# Patient Record
Sex: Male | Born: 1990 | Race: White | Hispanic: No | Marital: Single | Smoking: Current every day smoker
Health system: Southern US, Community
[De-identification: ages and names within clinical notes are randomized; demographics above are authoritative.]

## PROBLEM LIST (undated history)

## (undated) DIAGNOSIS — F17200 Nicotine dependence, unspecified, uncomplicated: Secondary | ICD-10-CM

## (undated) DIAGNOSIS — Z8619 Personal history of other infectious and parasitic diseases: Secondary | ICD-10-CM

## (undated) DIAGNOSIS — G40909 Epilepsy, unspecified, not intractable, without status epilepticus: Secondary | ICD-10-CM

## (undated) HISTORY — DX: Nicotine dependence, unspecified, uncomplicated: F17.200

## (undated) HISTORY — DX: Personal history of other infectious and parasitic diseases: Z86.19

---
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
## 2012-02-07 ENCOUNTER — Emergency Department (HOSPITAL_COMMUNITY)
Admission: EM | Admit: 2012-02-07 | Discharge: 2012-02-07 | Disposition: A | Discharge status: Home / Self Care | Payer: BC Managed Care – PPO | Source: Home / Self Care | Attending: Emergency Medicine | Admitting: Emergency Medicine

## 2012-02-07 ENCOUNTER — Encounter (HOSPITAL_COMMUNITY): Payer: Self-pay | Admitting: *Deleted

## 2012-02-07 DIAGNOSIS — R1013 Epigastric pain: Secondary | ICD-10-CM

## 2012-02-07 DIAGNOSIS — K3189 Other diseases of stomach and duodenum: Secondary | ICD-10-CM

## 2012-02-07 HISTORY — DX: Epilepsy, unspecified, not intractable, without status epilepticus: G40.909

## 2012-02-07 LAB — CBC WITH DIFFERENTIAL/PLATELET
Basophils Absolute: 0.1 10*3/uL (ref 0.0–0.1)
Basophils Relative: 1 % (ref 0–1)
Eosinophils Absolute: 0.3 10*3/uL (ref 0.0–0.7)
Eosinophils Relative: 2 % (ref 0–5)
HCT: 44.4 % (ref 39.0–52.0)
Hemoglobin: 15.8 g/dL (ref 13.0–17.0)
Lymphocytes Relative: 19 % (ref 12–46)
Lymphs Abs: 2 10*3/uL (ref 0.7–4.0)
MCH: 31 pg (ref 26.0–34.0)
MCHC: 35.6 g/dL (ref 30.0–36.0)
MCV: 87.2 fL (ref 78.0–100.0)
Monocytes Absolute: 1.2 10*3/uL — ABNORMAL HIGH (ref 0.1–1.0)
Monocytes Relative: 12 % (ref 3–12)
Neutro Abs: 7 10*3/uL (ref 1.7–7.7)
Neutrophils Relative %: 67 % (ref 43–77)
Platelets: 258 10*3/uL (ref 150–400)
RBC: 5.09 MIL/uL (ref 4.22–5.81)
RDW: 12.5 % (ref 11.5–15.5)
WBC: 10.4 10*3/uL (ref 4.0–10.5)

## 2012-02-07 LAB — POCT H PYLORI SCREEN: H. PYLORI SCREEN, POC: NEGATIVE

## 2012-02-07 MED ORDER — OMEPRAZOLE 20 MG PO CPDR: 20.0000 mg | DELAYED_RELEASE_CAPSULE | Freq: Two times a day (BID) | ORAL | Status: DC

## 2012-02-07 MED ORDER — SUCRALFATE 1 GM/10ML PO SUSP: 1.0000 g | Freq: Four times a day (QID) | ORAL | Status: DC

## 2012-02-07 NOTE — ED Provider Notes (Signed)
Chief Complaint  Patient presents with  . Abdominal Pain    History of Present Illness:    The patient is a 21 year old male with a one-week history of bilateral mid and periumbilical abdominal pain without radiation. This sometimes feels like a cramping and sometimes like a sharp pain. The pain waxes and wanes and sometimes gets up to 6-7/10 in intensity. It's worse after eating food, especially fried foods, spicy foods, or first thing in the morning. It's better after a bowel movement and he tried TUMS which did seem to help for about 30 minutes. He's had some sweats but no fever or chills. He's felt occasionally nauseated and vomited twice. No hematemesis, coffee-ground emesis, or bilious emesis. He feels tired, run down, and drained. He doesn't have much of an appetite. His stools have been a little bit loose but he denies any hematochezia or melena. He smokes a half pack of cigarettes per day and is a social drinker. He drinks sodas and tea. He has a history of seizures and is on seizure medication. He denies any history of ulcer disease.  Review of Systems:  Other than noted above, the patient denies any of the following symptoms: Constitutional:  No fever, chills, fatigue, weight loss or anorexia. Lungs:  No cough or shortness of breath. Heart:  No chest pain, palpitations, syncope or edema.  No cardiac history. Abdomen:  No nausea, vomiting, hematememesis, melena, diarrhea, or hematochezia. GU:  No dysuria, frequency, urgency, or hematuria.  No testicular pain or swelling. Skin:  No rash or itching.  PMFSH:  Past medical history, family history, social history, meds, and allergies were reviewed along with nurse's notes.  No prior abdominal surgeries or history of GI problems.  No use of NSAIDs or aspirin.  No excessive  alcohol intake.  Physical Exam:   Vital signs:  BP 148/74  Pulse 50  Temp 98.1 F (36.7 C) (Oral)  Resp 16  SpO2 100% Gen:  Alert, oriented, in no distress. Lungs:   Breath sounds clear and equal bilaterally.  No wheezes, rales or rhonchi. Heart:  Regular rhythm.  No gallops or murmers.   Abdomen:  Abdomen was soft, flat, nondistended. He has slight epigastric tenderness to palpation without guarding or rebound. Murphy sign and Murphy's punch were negative. No organomegaly or mass. Bowel sounds were normally active. Rectal exam:  Digital rectal exam reveals no masses, normal prostate, no tenderness, and heme-negative stool. Skin:  Clear, warm and dry.  No rash.  Labs:   Results for orders placed during the hospital encounter of 02/07/12  CBC WITH DIFFERENTIAL      Component Value Range   WBC 10.4  4.0 - 10.5 K/uL   RBC 5.09  4.22 - 5.81 MIL/uL   Hemoglobin 15.8  13.0 - 17.0 g/dL   HCT 69.6  29.5 - 28.4 %   MCV 87.2  78.0 - 100.0 fL   MCH 31.0  26.0 - 34.0 pg   MCHC 35.6  30.0 - 36.0 g/dL   RDW 13.2  44.0 - 10.2 %   Platelets 258  150 - 400 K/uL   Neutrophils Relative 67  43 - 77 %   Neutro Abs 7.0  1.7 - 7.7 K/uL   Lymphocytes Relative 19  12 - 46 %   Lymphs Abs 2.0  0.7 - 4.0 K/uL   Monocytes Relative 12  3 - 12 %   Monocytes Absolute 1.2 (*) 0.1 - 1.0 K/uL   Eosinophils Relative 2  0 -  5 %   Eosinophils Absolute 0.3  0.0 - 0.7 K/uL   Basophils Relative 1  0 - 1 %   Basophils Absolute 0.1  0.0 - 0.1 K/uL  POCT H PYLORI SCREEN      Component Value Range   H. PYLORI SCREEN, POC NEGATIVE  NEGATIVE  OCCULT BLOOD, POC DEVICE      Component Value Range   Fecal Occult Bld NEGATIVE       Assessment:  The encounter diagnosis was Dyspepsia. With gastritis, ulcer disease, reflux esophagitis being possibilities. Also irritable bowel syndrome is a possibility as well.  Plan:   1.  The following meds were prescribed:   New Prescriptions   OMEPRAZOLE (PRILOSEC) 20 MG CAPSULE    Take 1 capsule (20 mg total) by mouth 2 (two) times daily before a meal.   SUCRALFATE (CARAFATE) 1 GM/10ML SUSPENSION    Take 10 mLs (1 g total) by mouth 4 (four) times  daily.   2.  The patient was instructed in symptomatic care and handouts were given. 3.  The patient was told to return if becoming worse in any way, if no better in 3 or 4 days, and given some red flag symptoms that would indicate earlier return. The patient was told to avoid spicy food, greasy food, acid foods, caffeine, and nonsteroidal anti-inflammatory drugs.  Follow up:  The patient was told to follow up with Dr. Vilinda Boehringer if no better in 2 weeks.     Reuben Likes, MD 02/07/12 [phone]

## 2012-02-07 NOTE — ED Notes (Signed)
Pt with c/o intermittent abdominal pain x 6 days  - low abdominal pain describes pain as the type pain have prior to bowel movement - loose bm's since Friday

## 2013-02-15 ENCOUNTER — Ambulatory Visit (INDEPENDENT_AMBULATORY_CARE_PROVIDER_SITE_OTHER): Payer: BC Managed Care – PPO | Admitting: Family Medicine

## 2013-02-15 ENCOUNTER — Encounter: Payer: Self-pay | Admitting: Family Medicine

## 2013-02-15 VITALS — BP 118/78 | HR 64 | Temp 97.9°F | Ht 73.5 in | Wt 252.8 lb

## 2013-02-15 DIAGNOSIS — G40909 Epilepsy, unspecified, not intractable, without status epilepticus: Secondary | ICD-10-CM | POA: Insufficient documentation

## 2013-02-15 DIAGNOSIS — F17200 Nicotine dependence, unspecified, uncomplicated: Secondary | ICD-10-CM | POA: Insufficient documentation

## 2013-02-15 DIAGNOSIS — L989 Disorder of the skin and subcutaneous tissue, unspecified: Secondary | ICD-10-CM

## 2013-02-15 NOTE — Addendum Note (Signed)
Addended by: Josph Macho A on: 02/15/2013 11:31 AM   Modules accepted: Orders

## 2013-02-15 NOTE — Assessment & Plan Note (Signed)
Slowly cutting back on his own.   Encouraged to continue this. Has e cig at home.

## 2013-02-15 NOTE — Patient Instructions (Addendum)
Good to see you today. I have requested records from Dr. Marjory Lies of latest office note and blood work. Return as needed or in 1-2 years for physical. Keep bandaid on for 24 hours then may remove and wash with soapy water, and pat dry.  Let us know if questions.

## 2013-02-15 NOTE — Assessment & Plan Note (Signed)
Stable on current dose keppra. I have requested latest office note from Dr. Marjory Lies.

## 2013-02-15 NOTE — Progress Notes (Signed)
Subjective:    Patient ID: Zachary [name], male    DOB: Jul 07, 1990, 22 y.o.   MRN: [id number]  HPI CC: new pt to establish  Would like skin tag on leg removed.  Present for years.  Not changing, but may have enlagted over years.  No bleeding.  Stays itchy and irritated because catches on clothes and thighs.  H/o seizure disorder - on keppra 1500mg  daily, seizure free since 10/2010.  06/2012 increased dose of keppra from 1000mg  to 1500mg .  Feels skipping meals increases seizure risk.  Smoking - 3-4 cig/day, backing down.  Has e cig at home.  Preventative: No recent CPE. Tetanus - did receive prior to college (4-5 yrs ago)  Caffeine: 1 cup/day Lives with mother and father, no pets.  1 older brother Occ: installing POS systems, security Edu: college Haematologist) Activity: golfs regularly Diet: good water, fruits/vegetables some  Medications and allergies reviewed and updated in chart.  Past histories reviewed and updated if relevant as below. There are no active problems to display for this patient.  Past Medical History  Diagnosis Date  . Seizure disorder 2011    [facility] at [facility]  . History of chicken pox    Past Surgical History  Procedure Laterality Date  . Appendectomy  2004   History  Substance Use Topics  . Smoking status: Current Every Day Smoker    Types: Cigarettes  . Smokeless tobacco: Never Used     Comment: 3-4 cigarettes daily  . Alcohol Use: Yes     Comment: Occasional (weekends) 10 per sitting   Family History  Problem Relation Age of Onset  . Cancer Maternal Aunt     breast  . Cancer Maternal Grandmother     breast  . Cancer Paternal Grandmother     breast  . Cancer Maternal Aunt 55    colon  . CAD Neg Hx   . Stroke Neg Hx   . Diabetes Neg Hx   . Hypertension Neg Hx    No Known Allergies Current Outpatient Prescriptions on File Prior to Visit  Medication Sig Dispense Refill  . levETIRAcetam (KEPPRA) 500 MG tablet Take 1,500 mg by mouth once.         No current facility-administered medications on file prior to visit.    Review of Systems  Constitutional: Negative for fever, chills, activity change, appetite change, fatigue and unexpected weight change.  HENT: Negative for hearing loss and neck pain.   Eyes: Negative for visual disturbance.  Respiratory: Negative for cough, chest tightness, shortness of breath and wheezing.   Cardiovascular: Negative for chest pain, palpitations and leg swelling.  Gastrointestinal: Positive for abdominal pain (occaisonal). Negative for nausea, vomiting, diarrhea, constipation, blood in stool and abdominal distention.  Genitourinary: Negative for hematuria and difficulty urinating.  Musculoskeletal: Negative for myalgias and arthralgias.  Skin: Negative for rash.  Neurological: Positive for seizures (none recently, controlled on Keppra). Negative for dizziness, syncope and headaches.  Hematological: Negative for adenopathy. Does not bruise/bleed easily.  Psychiatric/Behavioral: Negative for dysphoric mood. The patient is not nervous/anxious.        Objective:   Physical Exam  Nursing note and vitals reviewed. Constitutional: He is oriented to person, place, and time. He appears well-developed and well-nourished. No distress.  HENT:  Head: Normocephalic and atraumatic.  Right Ear: Hearing, tympanic membrane, external ear and ear canal normal.  Left Ear: Hearing, tympanic membrane, external ear and ear canal normal.  Nose: Nose normal.  Mouth/Throat: Oropharynx is clear  and moist. No oropharyngeal exudate.  Eyes: Conjunctivae and EOM are normal. Pupils are equal, round, and reactive to light. No scleral icterus.  Neck: Normal range of motion. Neck supple. No thyromegaly present.  Cardiovascular: Normal rate, regular rhythm, normal heart sounds and intact distal pulses.   No murmur heard. Pulses:      Radial pulses are 2+ on the right side, and 2+ on the left side.  Pulmonary/Chest: Effort  normal and breath sounds normal. No respiratory distress. He has no wheezes. He has no rales.  Abdominal: Soft. Bowel sounds are normal. He exhibits no distension and no mass. There is no tenderness. There is no rebound and no guarding.  Musculoskeletal: Normal range of motion. He exhibits no edema.  Lymphadenopathy:    He has no cervical adenopathy.  Neurological: He is alert and oriented to person, place, and time.  CN grossly intact, station and gait intact  Skin: Skin is warm and dry. No rash noted.     Large pedunculated skin growth on left upper inner thigh  Psychiatric: He has a normal mood and affect. His behavior is normal. Judgment and thought content normal.       Assessment & Plan:

## 2013-02-15 NOTE — Assessment & Plan Note (Addendum)
IC obtained and in chart.  Area cleaned with alcohol pad and then anesthetized with 1cc lidocaine with epi.  Base snipped with sterile scissors and cauterized with silver nitrate.  Dressed with abx ointment and bandaid.  Aftercare instructions provided. Somewhat large skin tag- will send to pathology to eval for NF.

## 2013-02-19 ENCOUNTER — Encounter: Payer: Self-pay | Admitting: *Deleted

## 2013-02-21 ENCOUNTER — Encounter: Payer: Self-pay | Admitting: Family Medicine

## 2013-05-21 ENCOUNTER — Telehealth: Payer: Self-pay | Admitting: Nurse Practitioner

## 2013-05-22 MED ORDER — LEVETIRACETAM ER 500 MG PO TB24: 1500.0000 mg | ORAL_TABLET | Freq: Every day | ORAL | Status: DC

## 2013-05-22 NOTE — Telephone Encounter (Signed)
I have not had access to Epic for over 2 weeks.  The Rx has now been sent.

## 2013-08-07 ENCOUNTER — Encounter: Payer: Self-pay | Admitting: Nurse Practitioner

## 2013-08-07 ENCOUNTER — Ambulatory Visit (INDEPENDENT_AMBULATORY_CARE_PROVIDER_SITE_OTHER): Payer: BC Managed Care – PPO | Admitting: Nurse Practitioner

## 2013-08-07 ENCOUNTER — Encounter (INDEPENDENT_AMBULATORY_CARE_PROVIDER_SITE_OTHER): Payer: Self-pay

## 2013-08-07 VITALS — BP 152/75 | HR 63 | Ht 73.0 in | Wt 257.0 lb

## 2013-08-07 DIAGNOSIS — G40209 Localization-related (focal) (partial) symptomatic epilepsy and epileptic syndromes with complex partial seizures, not intractable, without status epilepticus: Secondary | ICD-10-CM

## 2013-08-07 MED ORDER — LEVETIRACETAM ER 500 MG PO TB24: 1500.0000 mg | ORAL_TABLET | Freq: Every day | ORAL | Status: DC

## 2013-08-07 NOTE — Patient Instructions (Signed)
Continue Keppra at current dose will refill Call for any seizure activity Follow-up yearly and when necessary 

## 2013-08-07 NOTE — Progress Notes (Signed)
[facility]  PATIENT: Zachary [name] DOB: 07/26/1990   REASON FOR VISIT: followup for seizure disorder   HISTORY OF PRESENT ILLNESS:[name], 23 year old male returns for followup. He has a history of seizure disorder with last seizure occurring 10/26/2010. He is currently on Keppra extended release 500 mg 3 daily without side effects. MRI of the brain and EEG were both normal. He returns for reevaluation and refills  PRIOR HPI: right-handed male with no past medical history here for evaluation of seizure disorder.  Patient had 3 seizures (Mar 2011, Mar 2012, Apr 2012).  Symptoms start with right sided hearing loss, ringing in the ear, head turning to the right, then loss of consciousness.  Some convulsions, tongue biting, but no incontinence.  Patient has been in college at [employer], but now has moved back home. No history of head trauma or CNS infx.  Has maternal cousin with childhood sz, but she outgrew them.      REVIEW OF SYSTEMS: Full 14 system review of systems performed and notable only for those listed, all others are neg:  Constitutional: N/A  Cardiovascular: N/A  Ear/Nose/Throat: N/A  Skin: N/A  Eyes: N/A  Respiratory: N/A  Gastroitestinal: N/A  Hematology/Lymphatic: N/A  Endocrine: N/A Musculoskeletal:N/A  Allergy/Immunology: N/A  Neurological: N/A Psychiatric: N/A   ALLERGIES: No Known Allergies  HOME MEDICATIONS: Outpatient Prescriptions Prior to Visit  Medication Sig Dispense Refill  . levETIRAcetam (KEPPRA XR) 500 MG 24 hr tablet Take 3 tablets (1,500 mg total) by mouth daily.  270 tablet  0   No facility-administered medications prior to visit.    PAST MEDICAL HISTORY: Past Medical History  Diagnosis Date  . Seizure disorder 2011    [facility], MRI and EEG WNL  . History of chicken pox   . Smoker     PAST SURGICAL HISTORY: Past Surgical History  Procedure Laterality Date  . Appendectomy  2004     FAMILY HISTORY: Family History  Problem Relation Age of Onset  . Cancer Maternal Aunt     breast  . Cancer Maternal Grandmother     breast  . Cancer Paternal Grandmother     breast  . Cancer Maternal Aunt 55    colon  . CAD Neg Hx   . Stroke Neg Hx   . Diabetes Neg Hx   . Hypertension Neg Hx     SOCIAL HISTORY: History   Social History  . Marital Status: Single    Spouse Name: N/A    Number of Children: N/A  . Years of Education: N/A   Occupational History  . Not on file.   Social History Main Topics  . Smoking status: Current Every Day Smoker    Types: Cigarettes  . Smokeless tobacco: Never Used     Comment: 3-4 cigarettes daily  . Alcohol Use: Yes     Comment: Occasional (weekends) 10 per sitting  . Drug Use: No  . Sexual Activity: Not on file   Other Topics Concern  . Not on file   Social History Narrative   Caffeine: 1 cup/day   Lives with mother and father, has 1 dog.  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           PHYSICAL EXAM  Filed Vitals:   08/07/13 1430  BP: 152/75  Pulse: 63  Height: 6\' 1"  (1.854 m)  Weight: 257 lb (116.574 kg)   Body mass index is 33.91 kg/(m^2).  Generalized: Well developed, in no acute distress    Neurological examination   Mentation: Alert oriented to time, place, history taking. Follows all commands speech and language fluent  Cranial nerve II-XII: Fundoscopic exam reveals sharp disc margins.Pupils were equal round reactive to light extraocular movements were full, visual field were full on confrontational test. Facial sensation and strength were normal. hearing was intact to finger rubbing bilaterally. Uvula tongue midline. head turning and shoulder shrug were normal and symmetric.Tongue protrusion into cheek strength was normal. Motor: normal bulk  and tone, full strength in the BUE, BLE, fine finger movements normal, no pronator drift. No focal weakness Coordination: finger-nose-finger, heel-to-shin bilaterally, no dysmetria Reflexes: Brachioradialis 2/2, biceps 2/2, triceps 2/2, patellar 2/2, Achilles 2/2, plantar responses were flexor bilaterally. Gait and Station: Rising up from seated position without assistance, normal stance,  moderate stride, good arm swing, smooth turning, able to perform tiptoe, and heel walking without difficulty. Tandem gait is steady  DIAGNOSTIC DATA (LABS, IMAGING, TESTING)   ASSESSMENT AND PLAN  23 y.o. year old male  has a past medical history of Seizure disorder (2011); currently stable on Keppra with last seizure occurring April 2012.   Continue Keppra at current dose will refill Call for any seizure activity Followup yearly and when necessary Nilda Riggs, [facility], [facility], APRN  [facility] [address], [address] [place], [place] [address] [phone]

## 2013-08-20 NOTE — Progress Notes (Signed)
I reviewed note and agree with plan.   Suanne MarkerVIKRAM R. PENUMALLI, MD 08/20/2013, 8:53 AM Certified in Neurology, Neurophysiology and Neuroimaging  [facility] [address], [address] [place], [place] [address] [phone]

## 2014-07-24 ENCOUNTER — Encounter (HOSPITAL_COMMUNITY): Payer: Self-pay | Admitting: *Deleted

## 2014-07-24 ENCOUNTER — Emergency Department (HOSPITAL_COMMUNITY): Payer: BLUE CROSS/BLUE SHIELD

## 2014-07-24 ENCOUNTER — Emergency Department (HOSPITAL_COMMUNITY)
Admission: EM | Admit: 2014-07-24 | Discharge: 2014-07-24 | Disposition: A | Discharge status: Home / Self Care | Payer: BLUE CROSS/BLUE SHIELD | Attending: Emergency Medicine | Admitting: Emergency Medicine

## 2014-07-24 DIAGNOSIS — Z23 Encounter for immunization: Secondary | ICD-10-CM | POA: Diagnosis not present

## 2014-07-24 DIAGNOSIS — G40909 Epilepsy, unspecified, not intractable, without status epilepticus: Secondary | ICD-10-CM | POA: Insufficient documentation

## 2014-07-24 DIAGNOSIS — Y9289 Other specified places as the place of occurrence of the external cause: Secondary | ICD-10-CM | POA: Insufficient documentation

## 2014-07-24 DIAGNOSIS — Y9389 Activity, other specified: Secondary | ICD-10-CM | POA: Diagnosis not present

## 2014-07-24 DIAGNOSIS — Z79899 Other long term (current) drug therapy: Secondary | ICD-10-CM | POA: Diagnosis not present

## 2014-07-24 DIAGNOSIS — S79922A Unspecified injury of left thigh, initial encounter: Secondary | ICD-10-CM | POA: Diagnosis not present

## 2014-07-24 DIAGNOSIS — W06XXXA Fall from bed, initial encounter: Secondary | ICD-10-CM | POA: Diagnosis not present

## 2014-07-24 DIAGNOSIS — S79921A Unspecified injury of right thigh, initial encounter: Secondary | ICD-10-CM | POA: Diagnosis not present

## 2014-07-24 DIAGNOSIS — S0990XA Unspecified injury of head, initial encounter: Secondary | ICD-10-CM | POA: Diagnosis present

## 2014-07-24 DIAGNOSIS — Z87898 Personal history of other specified conditions: Secondary | ICD-10-CM

## 2014-07-24 DIAGNOSIS — S0101XA Laceration without foreign body of scalp, initial encounter: Secondary | ICD-10-CM

## 2014-07-24 DIAGNOSIS — W19XXXA Unspecified fall, initial encounter: Secondary | ICD-10-CM

## 2014-07-24 DIAGNOSIS — Z8619 Personal history of other infectious and parasitic diseases: Secondary | ICD-10-CM | POA: Diagnosis not present

## 2014-07-24 DIAGNOSIS — M79605 Pain in left leg: Secondary | ICD-10-CM

## 2014-07-24 DIAGNOSIS — Y998 Other external cause status: Secondary | ICD-10-CM | POA: Diagnosis not present

## 2014-07-24 DIAGNOSIS — M79604 Pain in right leg: Secondary | ICD-10-CM

## 2014-07-24 DIAGNOSIS — Z720 Tobacco use: Secondary | ICD-10-CM | POA: Diagnosis not present

## 2014-07-24 LAB — BASIC METABOLIC PANEL
ANION GAP: 13 (ref 5–15)
BUN: 11 mg/dL (ref 6–23)
CALCIUM: 9.1 mg/dL (ref 8.4–10.5)
CO2: 19 mmol/L (ref 19–32)
Chloride: 105 mEq/L (ref 96–112)
Creatinine, Ser: 1.1 mg/dL (ref 0.50–1.35)
Glucose, Bld: 140 mg/dL — ABNORMAL HIGH (ref 70–99)
POTASSIUM: 5.7 mmol/L — AB (ref 3.5–5.1)
SODIUM: 137 mmol/L (ref 135–145)

## 2014-07-24 LAB — CBC
HCT: 45.1 % (ref 39.0–52.0)
Hemoglobin: 16 g/dL (ref 13.0–17.0)
MCH: 30.8 pg (ref 26.0–34.0)
MCHC: 35.5 g/dL (ref 30.0–36.0)
MCV: 86.7 fL (ref 78.0–100.0)
PLATELETS: 316 10*3/uL (ref 150–400)
RBC: 5.2 MIL/uL (ref 4.22–5.81)
RDW: 12.4 % (ref 11.5–15.5)
WBC: 18.2 10*3/uL — AB (ref 4.0–10.5)

## 2014-07-24 LAB — CBG MONITORING, ED: GLUCOSE-CAPILLARY: 159 mg/dL — AB (ref 70–99)

## 2014-07-24 MED ORDER — HYDROCODONE-ACETAMINOPHEN 5-325 MG PO TABS
1.0000 | ORAL_TABLET | Freq: Once | ORAL | Status: AC
  Administered 2014-07-24: 1 via ORAL
  Filled 2014-07-24: qty 1

## 2014-07-24 MED ORDER — ONDANSETRON HCL 4 MG/2ML IJ SOLN
4.0000 mg | Freq: Once | INTRAMUSCULAR | Status: AC
  Administered 2014-07-24: 4 mg via INTRAVENOUS
  Filled 2014-07-24: qty 2

## 2014-07-24 MED ORDER — LIDOCAINE HCL (PF) 1 % IJ SOLN
5.0000 mL | Freq: Once | INTRAMUSCULAR | Status: AC
  Administered 2014-07-24: 5 mL
  Filled 2014-07-24: qty 5

## 2014-07-24 MED ORDER — LEVETIRACETAM 750 MG PO TABS
1500.0000 mg | ORAL_TABLET | Freq: Once | ORAL | Status: AC
  Administered 2014-07-24: 1500 mg via ORAL
  Filled 2014-07-24: qty 2

## 2014-07-24 MED ORDER — TETANUS-DIPHTH-ACELL PERTUSSIS 5-2.5-18.5 LF-MCG/0.5 IM SUSP
0.5000 mL | Freq: Once | INTRAMUSCULAR | Status: AC
  Administered 2014-07-24: 0.5 mL via INTRAMUSCULAR
  Filled 2014-07-24: qty 0.5

## 2014-07-24 NOTE — ED Notes (Signed)
Patient father states he heard a loud noise and family found patient on the floor in his room.  Patient states he could not sleep well last night.  Patient does not recall what happened.  Patient is alert.  Patient has hx of seizures.  He takes his meds as directed,  Patient is on keppra for seizures.  Patient states his legs are sore bil lower legs.  Patient has a large laceration to his head.  Father states he has had dry heaves x 2-3 times since the fall.

## 2014-07-24 NOTE — ED Provider Notes (Signed)
CSN: [id number]     Arrival date & time 07/24/14  [id number] History   First MD Initiated Contact with Patient 07/24/14 0809     Chief Complaint  Patient presents with  . Fall  . Head Injury     (Consider location/radiation/quality/duration/timing/severity/associated sxs/prior Treatment) HPI  Zachary [name] is a 24 y.o. male with PMH of seizures on Keppra presenting with fall from his bed with right scalp laceration. Patient's father heard a loudhe came into the room. The patient does not remember the incident. The father states patient was confused about what it happened but was oriented to person time and place. Family states he is currently mentating at baseline. Family states patient has had a total of 3 seizures in his lifetime. He has appointment with his neurologist, [facility] next month. Family states he has not had a seizure 8 years. Patient reports compliance with his medication with his last dose yesterday morning. He takes medication daily. Patient denies headache, neck pain, slurred speech, weakness. Patient with complaints of bilateral calf soreness. He states it feels like he has had cramps. Father also states he has had dry heaves 3 since the fall. A shunt was in bed about 1-2 feet above the floor.   Past Medical History  Diagnosis Date  . Seizure disorder 2011    [facility]  . History of chicken pox   . Smoker    Past Surgical History  Procedure Laterality Date  . Appendectomy  2004   Family History  Problem Relation Age of Onset  . Cancer Maternal Aunt     breast  . Cancer Maternal Grandmother     breast  . Cancer Paternal Grandmother     breast  . Cancer Maternal Aunt 55    colon  . CAD Neg Hx   . Stroke Neg Hx   . Diabetes Neg Hx   . Hypertension Neg Hx    History  Substance Use Topics  . Smoking status: Current Every Day Smoker    Types: Cigarettes  . Smokeless tobacco: Never Used     Comment: 3-4 cigarettes daily  . Alcohol Use: Yes      Comment: Occasional (weekends) 10 per sitting    Review of Systems 10 Systems reviewed and are negative for acute change except as noted in the HPI.    Allergies  Review of patient's allergies indicates no known allergies.  Home Medications   Prior to Admission medications   Medication Sig Start Date End Date Taking? Authorizing Provider  acetaminophen (TYLENOL) 500 MG tablet Take 1,000 mg by mouth every 6 (six) hours as needed for mild pain.   Yes Historical Provider, MD  levETIRAcetam (KEPPRA XR) 500 MG 24 hr tablet Take 3 tablets (1,500 mg total) by mouth daily. 08/07/13  Yes Nilda RiggsNancy Carolyn Martin, NP   BP 141/65 mmHg  Pulse 78  Temp(Src) 98 F (36.7 C) (Oral)  Resp 20  Ht 6\' 1"  (1.854 m)  Wt 255 lb (115.667 kg)  BMI 33.65 kg/m2  SpO2 99% Physical Exam  Constitutional: He appears well-developed and well-nourished. No distress.  HENT:  Head: Normocephalic and atraumatic.  Mouth/Throat: Oropharynx is clear and moist.  6cm laceration to right scalp to right forehead.  Eyes: Conjunctivae and EOM are normal. Pupils are equal, round, and reactive to light. Right eye exhibits no discharge. Left eye exhibits no discharge.  Neck: Normal range of motion. Neck supple.  No nuchal rigidity  Cardiovascular: Normal  rate, regular rhythm and normal heart sounds.   Pulmonary/Chest: Effort normal and breath sounds normal. No respiratory distress. He has no wheezes.  Abdominal: Soft. Bowel sounds are normal. He exhibits no distension. There is no tenderness.  Musculoskeletal:  Bilateral calf tightness with tenderness. No swelling, erythema.   Neurological: He is alert. No cranial nerve deficit. He exhibits normal muscle tone. Coordination normal.  Speech is clear and goal oriented. Peripheral visual fields intact. Strength 5/5 in upper and lower extremities. Sensation intact. Intact rapid alternating movements, finger to nose, and heel to shin. Negative Romberg. No pronator drift. Normal  gait.    Skin: Skin is warm and dry. He is not diaphoretic.  Nursing note and vitals reviewed.   ED Course  Procedures (including critical care time) Labs Review Labs Reviewed  BASIC METABOLIC PANEL - Abnormal; Notable for the following:    Potassium 5.7 (*)    Glucose, Bld 140 (*)    All other components within normal limits  CBC - Abnormal; Notable for the following:    WBC 18.2 (*)    All other components within normal limits  CBG MONITORING, ED - Abnormal; Notable for the following:    Glucose-Capillary 159 (*)    All other components within normal limits  LEVETIRACETAM LEVEL    Imaging Review Ct Head Wo Contrast  07/24/2014   CLINICAL DATA:  History of seizures, right forehead laceration  EXAM: CT HEAD WITHOUT CONTRAST  TECHNIQUE: Contiguous axial images were obtained from the base of the skull through the vertex without intravenous contrast.  COMPARISON:  None.  FINDINGS: No skull fracture is noted. Mild mucosal thickening right ethmoid air cells. The mastoid air cells are unremarkable.  No intracranial hemorrhage, mass effect or midline shift. No acute infarction. No mass lesion is noted on this unenhanced scan. No hydrocephalus. The gray and white-matter differentiation is preserved.  IMPRESSION: No acute intracranial abnormality.   Electronically Signed   By: Natasha Mead M.D.   On: 07/24/2014 09:14     EKG Interpretation   Date/Time:  Wednesday July 24 2014 08:14:26 EST Ventricular Rate:  78 PR Interval:  159 QRS Duration: 88 QT Interval:  357 QTC Calculation: 407 R Axis:   54 Text Interpretation:  Sinus rhythm No old tracing to compare Confirmed by  CAMPOS  MD, Caryn Bee ([id number]) on 07/24/2014 8:27:11 AM      LACERATION REPAIR Performed by: Louann Sjogren Authorized by: Louann Sjogren Consent: Verbal consent obtained. Risks and benefits: risks, benefits and alternatives were discussed Consent given by: patient Patient identity confirmed: provided  demographic data Prepped and Draped in normal sterile fashion Wound explored  Laceration Location: scalp  Laceration Length: 6 cm  No Foreign Bodies seen or palpated  Anesthesia: local infiltration  Local anesthetic: lidocaine 1% w/o epinephrine  Anesthetic total: 4 ml  Irrigation method: syringe Amount of cleaning: standard  Skin closure: staples  Number of sutures: 5  Patient tolerance: Patient tolerated the procedure well with no immediate complications.   MDM   Final diagnoses:  Head injury  Fall, initial encounter  Scalp laceration, initial encounter  Bilateral lower extremity pain  History of seizures   Patient with history of seizures on Keppra and reports compliance presenting with fall last night with head laceration. Patient states he did not recall what happened but he was immediately oriented per father. Patient without focal deficits on exam and is mentating at baseline. Patient with possible seizure. Pt with dry heaving. No neck  pain, no headache. No neurological deficits. Cannot clear by [place] CT rule. Will get CT. CT unremarkable. I doubt SAH, ICH. He has neurology appointment in 2 weeks. Patient given seizure precautions until hes been evaluated by neurology. Strict return precautions discussed. Pt with hyperkalemia. Sample hemolyzed.   Tdap booster given. Laceration occurred < 8 hours prior to repair which was well tolerated. VSS. Neurovascularly intact. Laceration anesthetized, irrigated and repaired without immediate complications. Pt has no co morbidities to effect normal wound healing. No antibiotics indicated. Patient with scalp laceration. Discussed suture home care w pt and answered questions. Pt to f-u for wound check and suture removal in 7-10 days. Pt is hemodynamically stable w no complaints prior to dc.    Discussed return precautions with patient. Discussed all results and patient verbalizes understanding and agrees with plan.  Case has been  discussed with Dr. Patria Mane who agrees with the above plan and to discharge.       Louann Sjogren, PA-C 07/24/14 1653  Lyanne Co, MD 07/25/14 [phone]

## 2014-07-24 NOTE — ED Notes (Signed)
Patient and parents verbalized understanding of discharge instructions.  To return for any return s/sx of seizure or head trauma

## 2014-07-24 NOTE — Discharge Instructions (Signed)
Do not drive, operate heavy machinery, go scuba diving, tub baths, unsupervised until evaluated by neurology.  Keep wound dry and do not remove dressing for 24 hours if possible. After that, wash gently morning and night (every 12 hours) with soap and water. Use a topical antibiotic ointment and cover with a bandaid or gauze.    Do NOT use rubbing alcohol or hydrogen peroxide, do not soak the area   Present to your primary care doctor or the urgent care of your choice, or the ED for suture removal in 7-10 days.   Every attempt was made to remove foreign body (contaminants) from the wound.  However, there is always a chance that some may remain in the wound. This can  increase your risk of infection.   If you see signs of infection (warmth, redness, tenderness, pus, sharp increase in pain, fever, red streaking in the skin) immediately return to the emergency department.   After the wound heals fully, apply sunscreen for 6-12 months to minimize scarring.   Laceration Care, Adult A laceration is a cut or lesion that goes through all layers of the skin and into the tissue just beneath the skin. TREATMENT  Some lacerations may not require closure. Some lacerations may not be able to be closed due to an increased risk of infection. It is important to see your caregiver as soon as possible after an injury to minimize the risk of infection and maximize the opportunity for successful closure. If closure is appropriate, pain medicines may be given, if needed. The wound will be cleaned to help prevent infection. Your caregiver will use stitches (sutures), staples, wound glue (adhesive), or skin adhesive strips to repair the laceration. These tools bring the skin edges together to allow for faster healing and a better cosmetic outcome. However, all wounds will heal with a scar. Once the wound has healed, scarring can be minimized by covering the wound with sunscreen during the day for 1 full year. HOME CARE  INSTRUCTIONS  For sutures or staples:  Keep the wound clean and dry.  If you were given a bandage (dressing), you should change it at least once a day. Also, change the dressing if it becomes wet or dirty, or as directed by your caregiver.  Wash the wound with soap and water 2 times a day. Rinse the wound off with water to remove all soap. Pat the wound dry with a clean towel.  After cleaning, apply a thin layer of the antibiotic ointment as recommended by your caregiver. This will help prevent infection and keep the dressing from sticking.  You may shower as usual after the first 24 hours. Do not soak the wound in water until the sutures are removed.  Only take over-the-counter or prescription medicines for pain, discomfort, or fever as directed by your caregiver.  Get your sutures or staples removed as directed by your caregiver. For skin adhesive strips:  Keep the wound clean and dry.  Do not get the skin adhesive strips wet. You may bathe carefully, using caution to keep the wound dry.  If the wound gets wet, pat it dry with a clean towel.  Skin adhesive strips will fall off on their own. You may trim the strips as the wound heals. Do not remove skin adhesive strips that are still stuck to the wound. They will fall off in time. For wound adhesive:  You may briefly wet your wound in the shower or bath. Do not soak or scrub the  wound. Do not swim. Avoid periods of heavy perspiration until the skin adhesive has fallen off on its own. After showering or bathing, gently pat the wound dry with a clean towel.  Do not apply liquid medicine, cream medicine, or ointment medicine to your wound while the skin adhesive is in place. This may loosen the film before your wound is healed.  If a dressing is placed over the wound, be careful not to apply tape directly over the skin adhesive. This may cause the adhesive to be pulled off before the wound is healed.  Avoid prolonged exposure to  sunlight or tanning lamps while the skin adhesive is in place. Exposure to ultraviolet light in the first year will darken the scar.  The skin adhesive will usually remain in place for 5 to 10 days, then naturally fall off the skin. Do not pick at the adhesive film. You may need a tetanus shot if:  You cannot remember when you had your last tetanus shot.  You have never had a tetanus shot. If you get a tetanus shot, your arm may swell, get red, and feel warm to the touch. This is common and not a problem. If you need a tetanus shot and you choose not to have one, there is a rare chance of getting tetanus. Sickness from tetanus can be serious. SEEK MEDICAL CARE IF:   You have redness, swelling, or increasing pain in the wound.  You see a red line that goes away from the wound.  You have yellowish-white fluid (pus) coming from the wound.  You have a fever.  You notice a bad smell coming from the wound or dressing.  Your wound breaks open before or after sutures have been removed.  You notice something coming out of the wound such as wood or glass.  Your wound is on your hand or foot and you cannot move a finger or toe. SEEK IMMEDIATE MEDICAL CARE IF:   Your pain is not controlled with prescribed medicine.  You have severe swelling around the wound causing pain and numbness or a change in color in your arm, hand, leg, or foot.  Your wound splits open and starts bleeding.  You have worsening numbness, weakness, or loss of function of any joint around or beyond the wound.  You develop painful lumps near the wound or on the skin anywhere on your body. MAKE SURE YOU:   Understand these instructions.  Will watch your condition.  Will get help right away if you are not doing well or get worse. Document Released: 06/21/2005 Document Revised: 09/13/2011 Document Reviewed: 12/15/2010 [employer] Patient Information 2015 [employer] [place]. This information is not intended to replace advice  given to you by your health care provider. Make sure you discuss any questions you have with your health care provider.

## 2014-07-26 LAB — LEVETIRACETAM LEVEL: Levetiracetam Lvl: 5 ug/mL

## 2014-08-02 ENCOUNTER — Ambulatory Visit (INDEPENDENT_AMBULATORY_CARE_PROVIDER_SITE_OTHER): Payer: BLUE CROSS/BLUE SHIELD | Admitting: Family Medicine

## 2014-08-02 VITALS — BP 124/78 | HR 68 | Temp 99.0°F | Wt 255.2 lb

## 2014-08-02 DIAGNOSIS — W19XXXA Unspecified fall, initial encounter: Secondary | ICD-10-CM | POA: Insufficient documentation

## 2014-08-02 DIAGNOSIS — S0101XD Laceration without foreign body of scalp, subsequent encounter: Secondary | ICD-10-CM

## 2014-08-02 DIAGNOSIS — S0101XA Laceration without foreign body of scalp, initial encounter: Secondary | ICD-10-CM | POA: Insufficient documentation

## 2014-08-02 DIAGNOSIS — W19XXXD Unspecified fall, subsequent encounter: Secondary | ICD-10-CM

## 2014-08-02 NOTE — Progress Notes (Signed)
BP 124/78 mmHg  Pulse 68  Temp(Src) 99 F (37.2 C) (Oral)  Wt 255 lb 4 oz (115.781 kg)   CC: ER f/u visit  Subjective:    Patient ID: Zachary [name], male    DOB: 01/11/1991, 24 y.o.   MRN: [id number]  HPI: Zachary [name] is a 24 y.o. male presenting on 08/02/2014 for Follow-up and Suture / Staple Removal   Recent fall at home on 07/24/2014 after fall sustained 6cm R scalp laceration closed with 5 staples. Was confused after fall but denies post ictal period. He has [facility] appt with [facility] next month.   States he woke up at 5am to go to bathroom, on his way back to bed tripped over shoe and hit head on dresser handle.   Pt does not think he had a seizure, states he did not feel like he previously felt after seizure (post ictal period), but there was some concern for concussion after fall.   Endorses mild L posterior headache that quickly resolves. No vision changes, further nausea or vomiting. Denies confusion or AMS. No EtOH.   Records from ER reviewed. Tdap given.  Relevant past medical, surgical, family and social history reviewed and updated as indicated. Interim medical history since our last visit reviewed. Allergies and medications reviewed and updated. Current Outpatient Prescriptions on File Prior to Visit  Medication Sig  . levETIRAcetam (KEPPRA XR) 500 MG 24 hr tablet Take 3 tablets (1,500 mg total) by mouth daily.  Marland Kitchen. acetaminophen (TYLENOL) 500 MG tablet Take 1,000 mg by mouth every 6 (six) hours as needed for mild pain.   No current facility-administered medications on file prior to visit.   Past Medical History  Diagnosis Date  . [facility]izure disorder 2011    [facility]  . History of chicken pox   . Smoker     Review of Systems Per HPI unless specifically indicated above     Objective:    BP 124/78 mmHg  Pulse 68  Temp(Src) 99 F (37.2 C) (Oral)  Wt 255 lb 4 oz (115.781 kg)  Wt Readings from Last 3 Encounters:  08/02/14 255 lb 4 oz  (115.781 kg)  07/24/14 255 lb (115.667 kg)  08/07/13 257 lb (116.574 kg)    Physical Exam  Constitutional: He appears well-developed and well-nourished. No distress.  Skin: Skin is warm and dry.  R anterior frontal scalp at hairline with laceration present, edges well approximated without erythema/induration or drainage. 5 staples removed with staple remover, pt tolerated well. Area cleaned with alcohol pad, post care instructions provided.  Nursing note and vitals reviewed.  Results for orders placed or performed during the hospital encounter of 07/24/14  Basic metabolic panel (if new onset seizures)  Result Value Ref Range   Sodium 137 135 - 145 mmol/L   Potassium 5.7 (H) 3.5 - 5.1 mmol/L   Chloride 105 96 - 112 mEq/L   CO2 19 19 - 32 mmol/L   Glucose, Bld 140 (H) 70 - 99 mg/dL   BUN 11 6 - 23 mg/dL   Creatinine, Ser 1.301.10 0.50 - 1.35 mg/dL   Calcium 9.1 8.4 - 86.510.5 mg/dL   GFR calc non Af Amer >90 >90 mL/min   GFR calc Af Amer >90 >90 mL/min   Anion gap 13 5 - 15  CBC (if new onset seizures)  Result Value Ref Range   WBC 18.2 (H) 4.0 - 10.5 K/uL   RBC 5.20 4.22 - 5.81  MIL/uL   Hemoglobin 16.0 13.0 - 17.0 g/dL   HCT 62.1 30.8 - 65.7 %   MCV 86.7 78.0 - 100.0 fL   MCH 30.8 26.0 - 34.0 pg   MCHC 35.5 30.0 - 36.0 g/dL   RDW 84.6 96.2 - 95.2 %   Platelets 316 150 - 400 K/uL  Levetiracetam level  Result Value Ref Range   Levetiracetam Lvl 5.0 mcg/mL  CBG monitoring, ED  Result Value Ref Range   Glucose-Capillary 159 (H) 70 - 99 mg/dL      Assessment & Plan:   Problem List Items Addressed This Visit    Laceration of scalp without complication - Primary    Staples removed, pt tolerated well. Post care instructions provided.      Fall    Does not sound consistent with seizure activity, rather mechanical fall early am while walking back to bed from bathroom.          Follow up plan: Return if symptoms worsen or fail to improve.

## 2014-08-02 NOTE — Assessment & Plan Note (Signed)
Staples removed, pt tolerated well. Post care instructions provided.

## 2014-08-02 NOTE — Patient Instructions (Addendum)
Staples removed today. Ok to wash gently, pat dry, may use neosporin on wound. Keep appointment with neurology. Could have had mild concussion but now much better. Not likely seizure.

## 2014-08-02 NOTE — Assessment & Plan Note (Signed)
Does not sound consistent with seizure activity, rather mechanical fall early am while walking back to bed from bathroom.

## 2014-08-02 NOTE — Progress Notes (Signed)
Pre visit review using our clinic review tool, if applicable. No additional management support is needed unless otherwise documented below in the visit note. 

## 2014-08-07 ENCOUNTER — Ambulatory Visit (INDEPENDENT_AMBULATORY_CARE_PROVIDER_SITE_OTHER): Payer: BLUE CROSS/BLUE SHIELD | Admitting: Nurse Practitioner

## 2014-08-07 ENCOUNTER — Encounter: Payer: Self-pay | Admitting: Nurse Practitioner

## 2014-08-07 VITALS — BP 120/74 | HR 62 | Ht 73.0 in | Wt 254.0 lb

## 2014-08-07 DIAGNOSIS — G40209 Localization-related (focal) (partial) symptomatic epilepsy and epileptic syndromes with complex partial seizures, not intractable, without status epilepticus: Secondary | ICD-10-CM

## 2014-08-07 DIAGNOSIS — S0101XD Laceration without foreign body of scalp, subsequent encounter: Secondary | ICD-10-CM

## 2014-08-07 DIAGNOSIS — R519 Headache, unspecified: Secondary | ICD-10-CM | POA: Insufficient documentation

## 2014-08-07 DIAGNOSIS — G40909 Epilepsy, unspecified, not intractable, without status epilepticus: Secondary | ICD-10-CM

## 2014-08-07 DIAGNOSIS — R51 Headache: Secondary | ICD-10-CM

## 2014-08-07 DIAGNOSIS — G44309 Post-traumatic headache, unspecified, not intractable: Secondary | ICD-10-CM

## 2014-08-07 MED ORDER — LEVETIRACETAM ER 500 MG PO TB24: 1500.0000 mg | ORAL_TABLET | Freq: Every day | ORAL | Status: DC

## 2014-08-07 NOTE — Patient Instructions (Signed)
Continue Keppra 1500mg  daily will refill Use Tylenol when necessary headache Return to usual activities Call for any seizure activity Follow-up in 6 months

## 2014-08-07 NOTE — Progress Notes (Signed)
[facility]  PATIENT: Zachary [name] DOB: 12/05/1990   REASON FOR VISIT: Follow-up for seizure disorder  HISTORY FROM: Patient, mother    HISTORY OF PRESENT ILLNESS:[name], 24 year old male returns for followup. He has a history of seizure disorder with last seizure occurring 10/26/2010. He was last seen in this office 08/07/2013. He apparently fell from his  bed on 07/24/2014. He did not lose consciousness and was awake and alert when his family checked on him. He had a scalp laceration on the right which required staples. He has since had those removed. CT of the head on 07/2014  without acute intracranial abnormality He is currently on Keppra extended release 500 mg 3 daily without side effects. Keppra level was within normal limits. He returns for his routine yearly evaluation. He needs refills on this medication. He denies missing any doses. He has intermittent headache relieved with Tylenol .   PRIOR HPI: right-handed male with no past medical history here for evaluation of seizure disorder. Patient had 3 seizures (Mar 2011, Mar 2012, Apr 2012). Symptoms start with right sided hearing loss, ringing in the ear, head turning to the right, then loss of consciousness. Some convulsions, tongue biting, but no incontinence. Patient has been in college at [employer], but now has moved back home. No history of head trauma or CNS infx. Has maternal cousin with childhood sz, but she outgrew them.   Follow-up of the girls that has went out to her car a lot set you could see it was my card to fall  REVIEW OF SYSTEMS: Full 14 system review of systems performed and notable only for those listed, all others are neg:  Constitutional: neg  Cardiovascular: neg Ear/Nose/Throat: neg  Skin: neg Eyes: neg Respiratory: neg Gastroitestinal: neg  Hematology/Lymphatic: neg  Endocrine: neg Musculoskeletal:neg Allergy/Immunology: neg Neurological: Headache    Psychiatric: neg Sleep : neg   ALLERGIES: No Known Allergies  HOME MEDICATIONS: Outpatient Prescriptions Prior to Visit  Medication Sig Dispense Refill  . acetaminophen (TYLENOL) 500 MG tablet Take 1,000 mg by mouth every 6 (six) hours as needed for mild pain.    Marland Kitchen. levETIRAcetam (KEPPRA XR) 500 MG 24 hr tablet Take 3 tablets (1,500 mg total) by mouth daily. 270 tablet 3   No facility-administered medications prior to visit.    PAST MEDICAL HISTORY: Past Medical History  Diagnosis Date  . Seizure disorder 2011    [facility]  . History of chicken pox   . Smoker     PAST SURGICAL HISTORY: Past Surgical History  Procedure Laterality Date  . Appendectomy  2004    FAMILY HISTORY: Family History  Problem Relation Age of Onset  . Cancer Maternal Aunt     breast  . Cancer Maternal Grandmother     breast  . Cancer Paternal Grandmother     breast  . Cancer Maternal Aunt 55    colon  . CAD Neg Hx   . Stroke Neg Hx   . Diabetes Neg Hx   . Hypertension Neg Hx   . High blood pressure Father     SOCIAL HISTORY: History   Social History  . Marital Status: Single    Spouse Name: N/A    Number of Children: 0  . Years of Education: college   Occupational History  .      Not working   Social History Main Topics  . Smoking status: Current Every Day Smoker  Types: Cigarettes  . Smokeless tobacco: Never Used     Comment: 3-4 cigarettes daily  . Alcohol Use: Yes     Comment: Occasional (weekends) 10 per sitting  . Drug Use: Yes    Special: Marijuana  . Sexual Activity: Not on file   Other Topics Concern  . Not on file   Social History Narrative   Caffeine: 1 cup/day   Lives with mother and father, has 1 dog.  1 older brother   Education full time Archivist.      Activity: golfs regularly   Diet: good water, fruits/vegetables some   Patient is single.           PHYSICAL EXAM  Filed Vitals:   08/07/14 1419  BP: 120/74   Pulse: 62  Height:  (1.854 m)  Weight: 254 lb (115.214 kg)   Body mass index is 33.52 kg/(m^2).  Generalized: Well developed, obese male in no acute distress  Head: normocephalic and atraumatic,. Oropharynx benign . Suture line right scalp healing without reddness edema or signs of infection Neck: Supple, no carotid bruits  Cardiac: Regular rate rhythm, no murmur  Musculoskeletal: No deformity   Neurological examination   Mentation: Alert oriented to time, place, history taking. Attention span and concentration appropriate. Recent and remote memory intact.  Follows all commands speech and language fluent.   Cranial nerve II-XII: Fundoscopic exam reveals sharp disc margins.Pupils were equal round reactive to light extraocular movements were full, visual field were full on confrontational test. Facial sensation and strength were normal. hearing was intact to finger rubbing bilaterally. Uvula tongue midline. head turning and shoulder shrug were normal and symmetric.Tongue protrusion into cheek strength was normal. Motor: normal bulk and tone, full strength in the BUE, BLE, fine finger movements normal, no pronator drift. No focal weakness Sensory: normal and symmetric to light touch, pinprick, and  Vibration, proprioception  Coordination: finger-nose-finger, heel-to-shin bilaterally, no dysmetria Reflexes: Brachioradialis 2/2, biceps 2/2, triceps 2/2, patellar 2/2, Achilles 2/2, plantar responses were flexor bilaterally. Gait and Station: Rising up from seated position without assistance, normal stance,  moderate stride, good arm swing, smooth turning, able to perform tiptoe, and heel walking without difficulty. Tandem gait is steady  DIAGNOSTIC DATA (LABS, IMAGING, TESTING) - I reviewed patient records, labs, notes, testing and imaging myself where available.  Lab Results  Component Value Date   WBC 18.2* 07/24/2014   HGB 16.0 07/24/2014   HCT 45.1 07/24/2014   MCV 86.7 07/24/2014    PLT 316 07/24/2014      Component Value Date/Time   NA 137 07/24/2014 0820   K 5.7* 07/24/2014 0820   CL 105 07/24/2014 0820   CO2 19 07/24/2014 0820   GLUCOSE 140* 07/24/2014 0820   BUN 11 07/24/2014 0820   CREATININE 1.10 07/24/2014 0820   CALCIUM 9.1 07/24/2014 0820   GFRNONAA >90 07/24/2014 0820   GFRAA >90 07/24/2014 0820     ASSESSMENT AND PLAN  24 y.o. year old male  has a past medical history of Seizure disorder (2011); recent fall requiring staples, and intermittent headache  here to follow up.   Continue Keppra  daily will refill Use Tylenol when necessary for headache Call if headache worsens Return to usual activities, except contact sports Call for any seizure activity Follow-up in 6 months Nilda Riggs, [facility], [facility], APRN  [facility] [address], [address] [place], [place] [address] [phone]

## 2014-08-20 NOTE — Progress Notes (Signed)
I reviewed note and agree with plan.   Taevyn Hausen R. Andra Matsuo, MD  Certified in Neurology, Neurophysiology and Neuroimaging  [facility] [address], [address] [place], [place] [address] [phone]   

## 2015-02-25 ENCOUNTER — Ambulatory Visit: Payer: BLUE CROSS/BLUE SHIELD | Admitting: Nurse Practitioner

## 2015-06-07 IMAGING — CT CT HEAD W/O CM
1 series · 16 of 30 positions shown, 20 images · non-contrast
Comparison: None.

CLINICAL DATA: History of seizures, right forehead laceration

EXAM:
CT HEAD WITHOUT CONTRAST
TECHNIQUE: Contiguous axial images were obtained from the base of the skull
through the vertex without intravenous contrast.

[Series 2: head 5.0 h30s · axial · 0.43mm/px · z∈[+1469,+1609]mm · 16 of 32 slices shown, 20 images]
[im 2/32  brain]
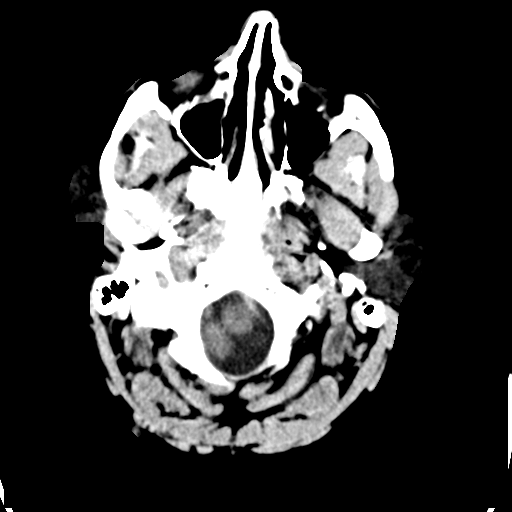
[im 2/32  bone]
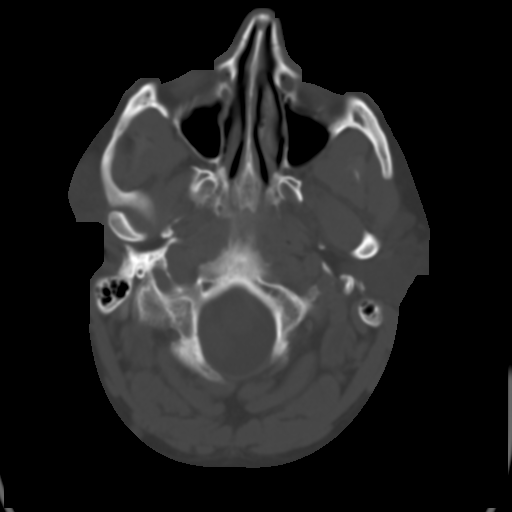
[im 4/32  brain]
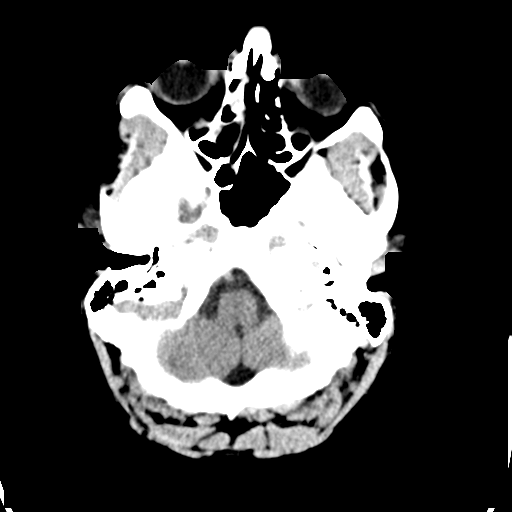
[im 6/32  brain]
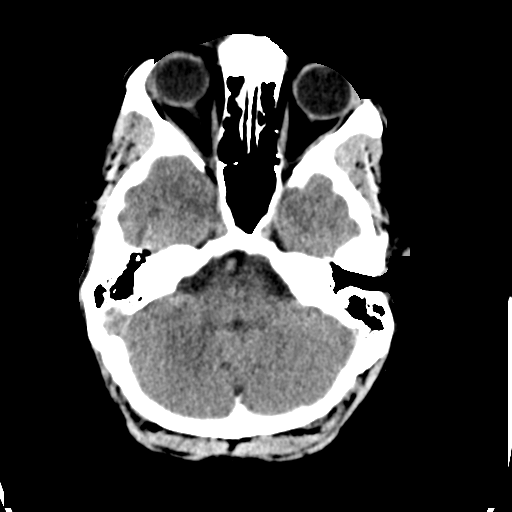
[im 8/32  brain]
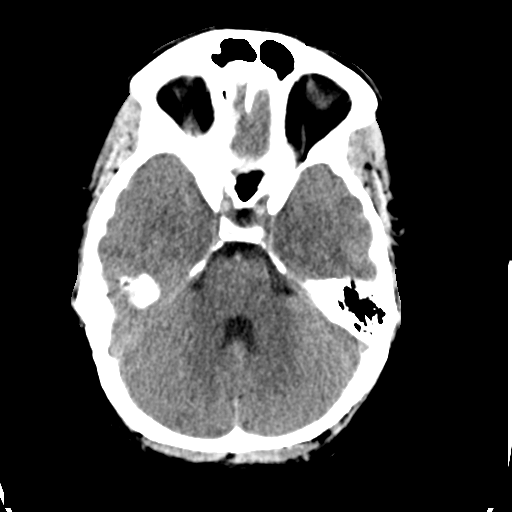
[im 9/32  brain]
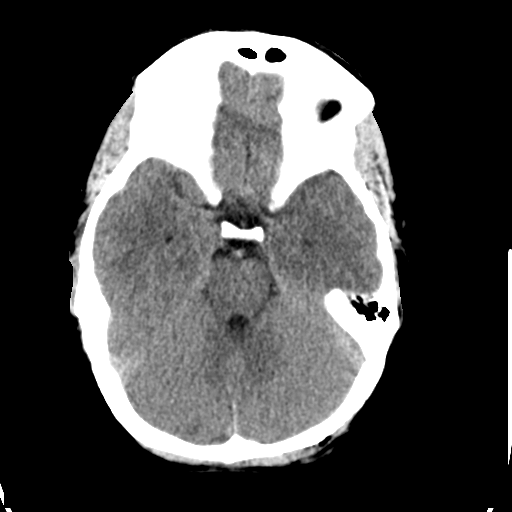
[im 9/32  bone]
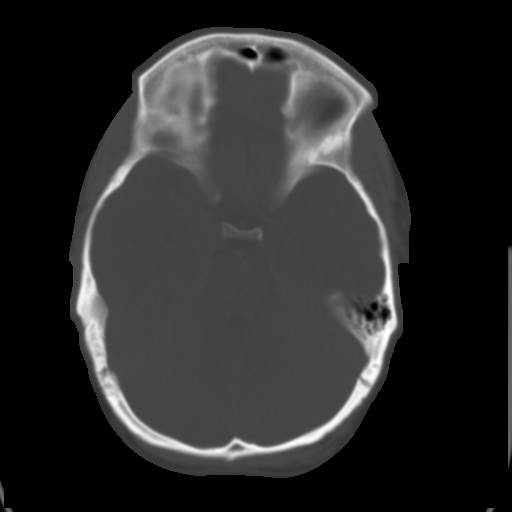
[im 11/32  brain]
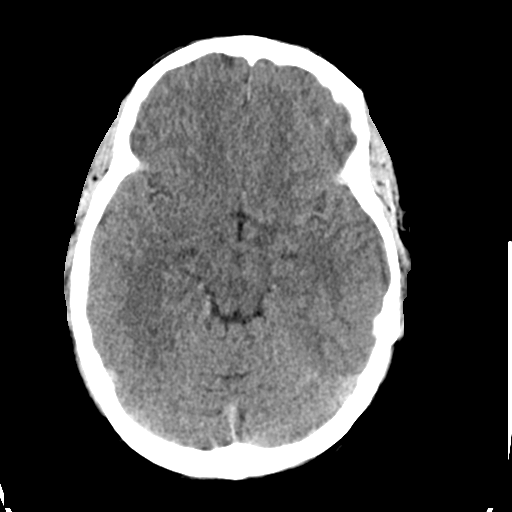
[im 13/32  brain]
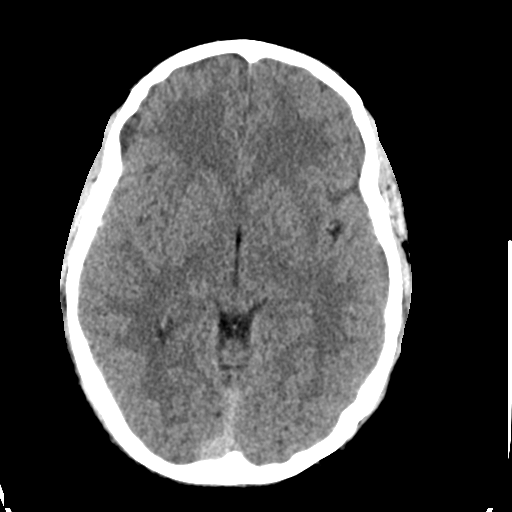
[im 15/32  brain]
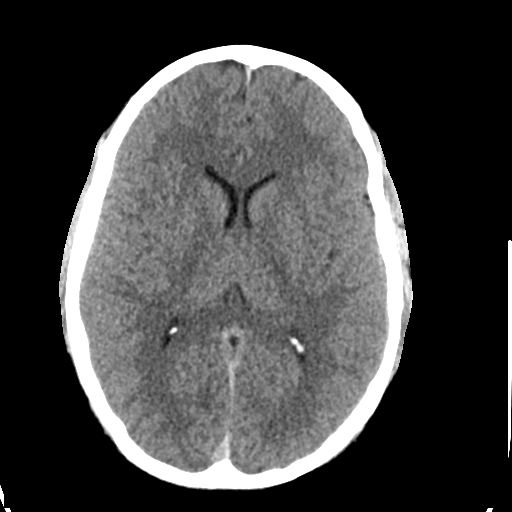
[im 17/32  brain]
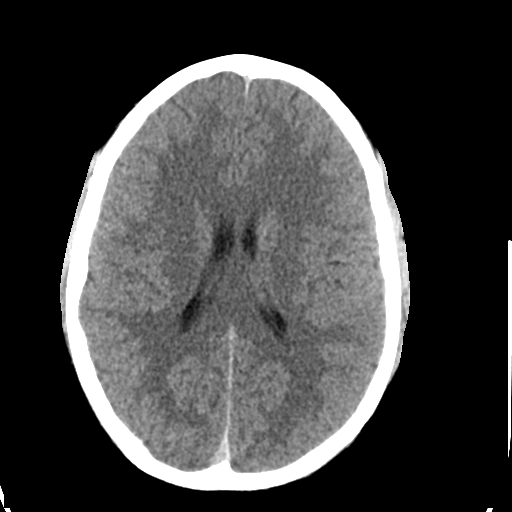
[im 17/32  bone]
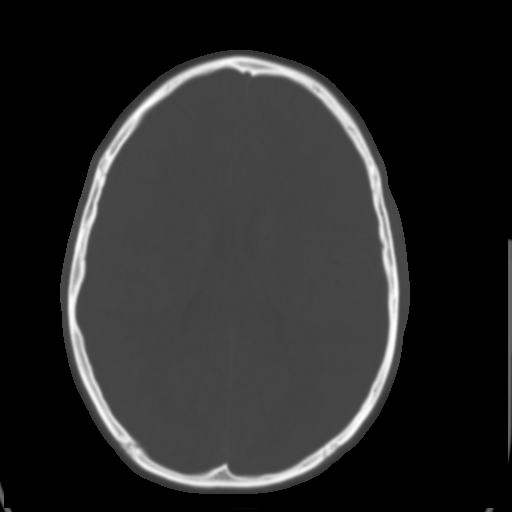
[im 19/32  brain]
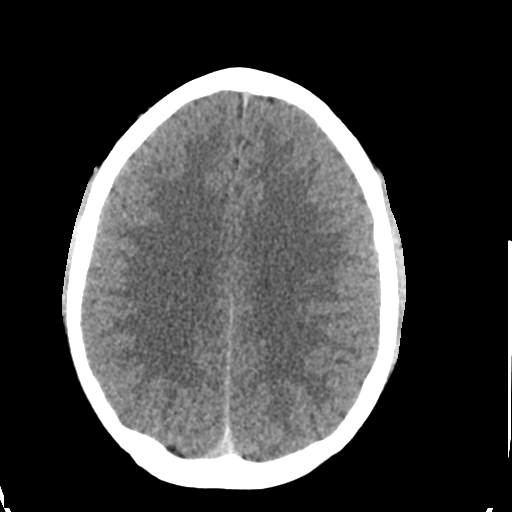
[im 21/32  brain]
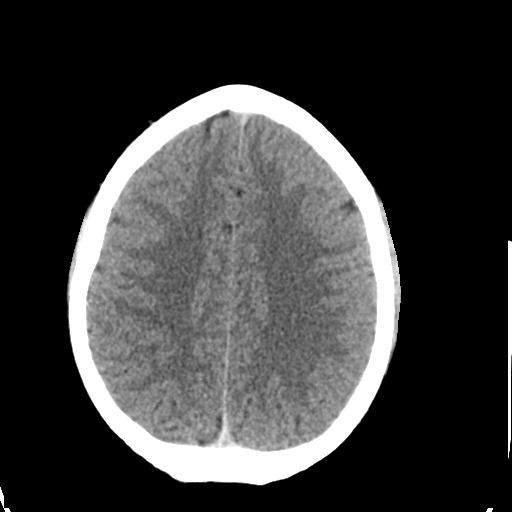
[im 23/32  brain]
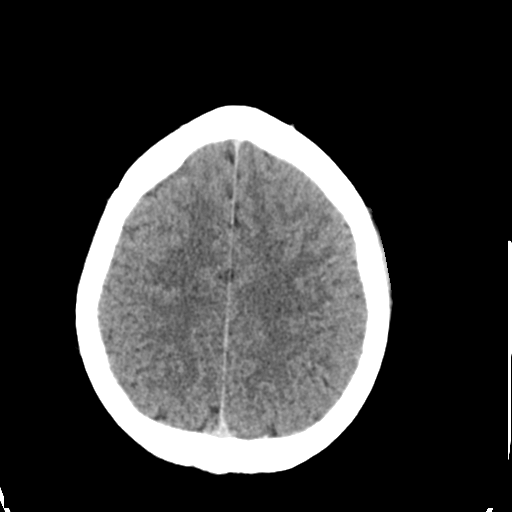
[im 24/32  brain]
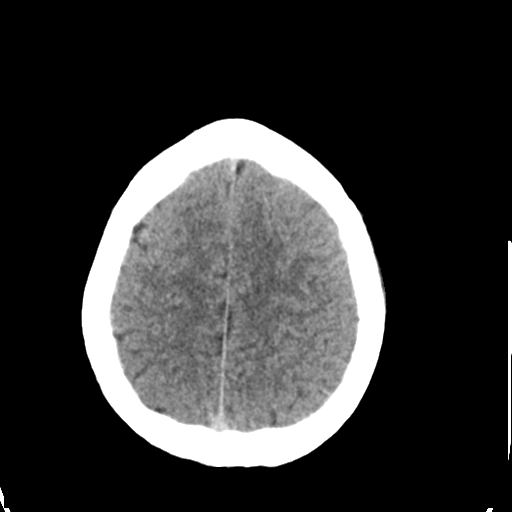
[im 24/32  bone]
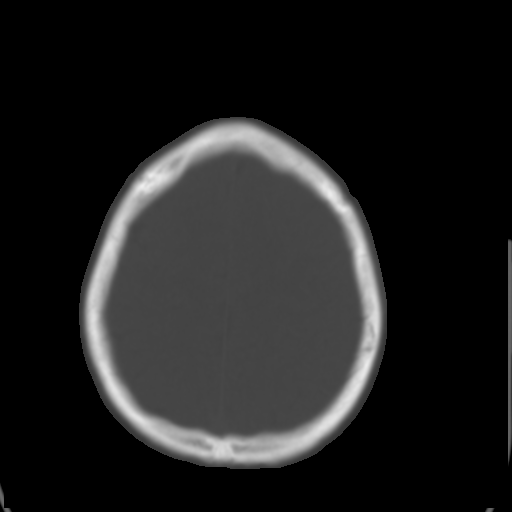
[im 26/32  brain]
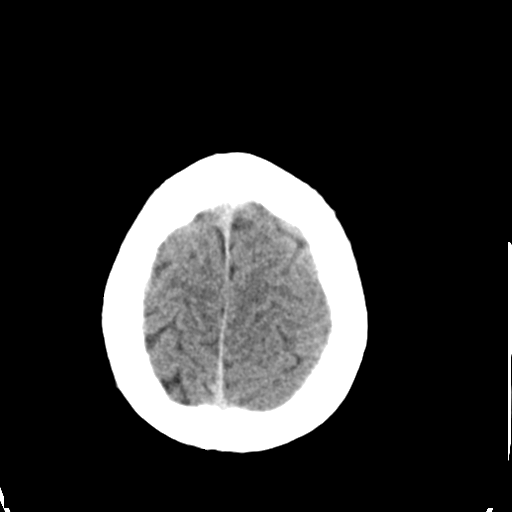
[im 28/32  brain]
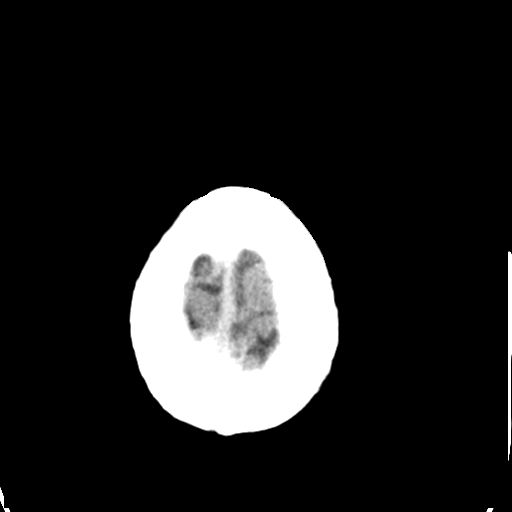
[im 30/32  brain]
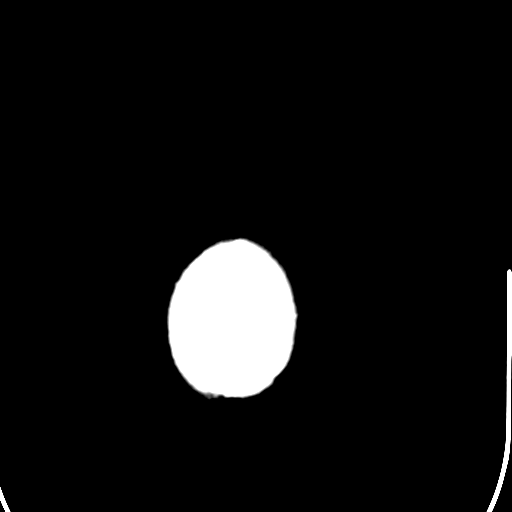

[16 of 30 positions shown; findings below may reference images not displayed]

FINDINGS: No skull fracture is noted. Mild mucosal thickening right ethmoid
air cells. The mastoid air cells are unremarkable.

No intracranial hemorrhage, mass effect or midline shift. No acute
infarction. No mass lesion is noted on this unenhanced scan. No
hydrocephalus. The gray and white-matter differentiation is
preserved.
IMPRESSION: No acute intracranial abnormality.

## 2015-07-24 ENCOUNTER — Other Ambulatory Visit: Payer: Self-pay | Admitting: Nurse Practitioner

## 2015-07-25 ENCOUNTER — Ambulatory Visit (INDEPENDENT_AMBULATORY_CARE_PROVIDER_SITE_OTHER): Payer: Managed Care, Other (non HMO) | Admitting: Nurse Practitioner

## 2015-07-25 ENCOUNTER — Encounter: Payer: Self-pay | Admitting: Nurse Practitioner

## 2015-07-25 VITALS — BP 136/81 | HR 90 | Ht 73.0 in | Wt 247.2 lb

## 2015-07-25 DIAGNOSIS — G40209 Localization-related (focal) (partial) symptomatic epilepsy and epileptic syndromes with complex partial seizures, not intractable, without status epilepticus: Secondary | ICD-10-CM

## 2015-07-25 DIAGNOSIS — Z5181 Encounter for therapeutic drug level monitoring: Secondary | ICD-10-CM

## 2015-07-25 DIAGNOSIS — G40909 Epilepsy, unspecified, not intractable, without status epilepticus: Secondary | ICD-10-CM | POA: Diagnosis not present

## 2015-07-25 NOTE — Progress Notes (Signed)
[facility]  PATIENT: Zachary [name] DOB: Nov 24, 1990   REASON FOR VISIT: follow-up for seizure disorder HISTORY FROM:patient and mother    HISTORY OF PRESENT ILLNESS:[name], 25 year old male returns for followup. He has a history of seizure disorder with last seizure occurring 07/11/15 and then previously 10/26/2010. His most recent seizure occurred in the setting of vital illness, it occurred during sleep and he awakened and had bitten his tongue. No incontinence.CT of the head on 07/2014 without acute intracranial abnormality He is currently on Keppra extended release 500 mg 3 daily without side effects. Keppra level has been within normal limits when last checked. He returns for his routine yearly evaluation. He needs refills on this medication. He denies missing any doses.    PRIOR HPI: right-handed male with no past medical history here for evaluation of seizure disorder. Patient had 3 seizures (Mar 2011, Mar 2012, Apr 2012). Symptoms start with right sided hearing loss, ringing in the ear, head turning to the right, then loss of consciousness. Some convulsions, tongue biting, but no incontinence. Patient has been in college at [employer], but now has moved back home. No history of head trauma or CNS infx. Has maternal cousin with childhood sz, but she outgrew them.    REVIEW OF SYSTEMS: Full 14 system review of systems performed and notable only for those listed, all others are neg:  Constitutional: neg  Cardiovascular: neg Ear/Nose/Throat: neg  Skin: neg Eyes: neg Respiratory: neg Gastroitestinal: neg  Hematology/Lymphatic: neg  Endocrine: neg Musculoskeletal:neg Allergy/Immunology: neg Neurological: neg Psychiatric: neg Sleep : neg   ALLERGIES: No Known Allergies  HOME MEDICATIONS: Outpatient Prescriptions Prior to Visit  Medication Sig Dispense Refill  . acetaminophen (TYLENOL) 500 MG tablet Take 1,000 mg by mouth every 6  (six) hours as needed for mild pain.    Marland Kitchen levETIRAcetam (KEPPRA XR) 500 MG 24 hr tablet TAKE THREE TABLETS BY MOUTH DAILY 90 tablet 0   No facility-administered medications prior to visit.    PAST MEDICAL HISTORY: Past Medical History  Diagnosis Date  . Seizure disorder (HCC) 2011    [facility], MRI and EEG WNL  . History of chicken pox   . Smoker     PAST SURGICAL HISTORY: Past Surgical History  Procedure Laterality Date  . Appendectomy  2004    FAMILY HISTORY: Family History  Problem Relation Age of Onset  . Cancer Maternal Aunt     breast  . Cancer Maternal Grandmother     breast  . Cancer Paternal Grandmother     breast  . Cancer Maternal Aunt 55    colon  . CAD Neg Hx   . Stroke Neg Hx   . Diabetes Neg Hx   . Hypertension Neg Hx   . High blood pressure Father     SOCIAL HISTORY: Social History   Social History  . Marital Status: Single    Spouse Name: N/A  . Number of Children: 0  . Years of Education: college   Occupational History  .      Not working   Social History Main Topics  . Smoking status: Current Every Day Smoker -- 0.50 packs/day    Types: Cigarettes  . Smokeless tobacco: Never Used     Comment: 3-4 cigarettes daily  . Alcohol Use: 0.0 oz/week    0 Standard drinks or equivalent per week     Comment: Occasional (weekends) 10 per sitting  . Drug Use: Yes  Special: Marijuana  . Sexual Activity: Not on file   Other Topics Concern  . Not on file   Social History Narrative   Caffeine: 1 cup/day   Lives with mother and father, has 1 dog.  1 older brother   Education full time Archivist.      Activity: golfs regularly   Diet: good water, fruits/vegetables some   Patient is single.           PHYSICAL EXAM  Filed Vitals:   07/25/15 1116  BP: 136/81  Pulse: 90  Height:  (1.854 m)  Weight: 247 lb 3.2 oz (112.129 kg)   Body mass index is 32.62 kg/(m^2). Generalized: Well developed, obese male in no acute  distress  Head: normocephalic and atraumatic,. Oropharynx benign .  Neck: Supple, no carotid bruits  Cardiac: Regular rate rhythm, no murmur  Musculoskeletal: No deformity   Neurological examination   Mentation: Alert oriented to time, place, history taking. Attention span and concentration appropriate. Recent and remote memory intact. Follows all commands speech and language fluent.   Cranial nerve II-XII: Fundoscopic exam reveals sharp disc margins.Pupils were equal round reactive to light extraocular movements were full, visual field were full on confrontational test. Facial sensation and strength were normal. hearing was intact to finger rubbing bilaterally. Uvula tongue midline. head turning and shoulder shrug were normal and symmetric.Tongue protrusion into cheek strength was normal. Motor: normal bulk and tone, full strength in the BUE, BLE, fine finger movements normal, no pronator drift. No focal weakness Sensory: normal and symmetric to light touch, pinprick, and Vibration, proprioception  Coordination: finger-nose-finger, heel-to-shin bilaterally, no dysmetria Reflexes: Brachioradialis 2/2, biceps 2/2, triceps 2/2, patellar 2/2, Achilles 2/2, plantar responses were flexor bilaterally. Gait and Station: Rising up from seated position without assistance, normal stance, moderate stride, good arm swing, smooth turning, able to perform tiptoe, and heel walking without difficulty. Tandem gait is steady  DIAGNOSTIC DATA (LABS, IMAGING, TESTING) -  ASSESSMENT AND PLAN  25 y.o. year old male  has a past medical history of Seizure disorder (HCC) (2011);seizure on 07/11/2015 in the setting of a viral illness. He denies missing any doses of his Keppra.  Will check labs today including Keppra level Will renew meds once level is back Follow-up in 6 months Call for any seizure activity Nilda Riggs, [facility], [facility], APRN  [facility] [address] [place], [place] [address] [phone]

## 2015-07-25 NOTE — Patient Instructions (Signed)
Will check labs today including Keppra level Will renew meds once level is back Follow-up in 6 months Call for any seizure activity

## 2015-07-28 ENCOUNTER — Telehealth: Payer: Self-pay | Admitting: Nurse Practitioner

## 2015-07-28 LAB — COMPREHENSIVE METABOLIC PANEL
A/G RATIO: 1.9 (ref 1.1–2.5)
ALT: 31 IU/L (ref 0–44)
AST: 18 IU/L (ref 0–40)
Albumin: 4.6 g/dL (ref 3.5–5.5)
Alkaline Phosphatase: 93 IU/L (ref 39–117)
BILIRUBIN TOTAL: 0.3 mg/dL (ref 0.0–1.2)
BUN / CREAT RATIO: 13 (ref 8–19)
BUN: 12 mg/dL (ref 6–20)
CHLORIDE: 102 mmol/L (ref 96–106)
CO2: 25 mmol/L (ref 18–29)
Calcium: 9.9 mg/dL (ref 8.7–10.2)
Creatinine, Ser: 0.89 mg/dL (ref 0.76–1.27)
GFR calc non Af Amer: 120 mL/min/{1.73_m2} (ref >59)
GFR, EST AFRICAN AMERICAN: 138 mL/min/{1.73_m2} (ref >59)
Globulin, Total: 2.4 g/dL (ref 1.5–4.5)
Glucose: 103 mg/dL — ABNORMAL HIGH (ref 65–99)
POTASSIUM: 4.7 mmol/L (ref 3.5–5.2)
SODIUM: 141 mmol/L (ref 134–144)
TOTAL PROTEIN: 7 g/dL (ref 6.0–8.5)

## 2015-07-28 LAB — CBC WITH DIFFERENTIAL/PLATELET
BASOS ABS: 0.1 10*3/uL (ref 0.0–0.2)
Basos: 1 %
EOS (ABSOLUTE): 0.2 10*3/uL (ref 0.0–0.4)
Eos: 3 %
Hematocrit: 46.7 % (ref 37.5–51.0)
Hemoglobin: 15.8 g/dL (ref 12.6–17.7)
Immature Grans (Abs): 0.1 10*3/uL (ref 0.0–0.1)
Immature Granulocytes: 1 %
LYMPHS ABS: 2.6 10*3/uL (ref 0.7–3.1)
LYMPHS: 27 %
MCH: 30.4 pg (ref 26.6–33.0)
MCHC: 33.8 g/dL (ref 31.5–35.7)
MCV: 90 fL (ref 79–97)
MONOS ABS: 1 10*3/uL — AB (ref 0.1–0.9)
Monocytes: 10 %
NEUTROS ABS: 5.8 10*3/uL (ref 1.4–7.0)
Neutrophils: 58 %
PLATELETS: 336 10*3/uL (ref 150–379)
RBC: 5.2 x10E6/uL (ref 4.14–5.80)
RDW: 13.3 % (ref 12.3–15.4)
WBC: 9.7 10*3/uL (ref 3.4–10.8)

## 2015-07-28 LAB — LEVETIRACETAM LEVEL: LEVETIRACETAM: 11.6 ug/mL (ref 10.0–40.0)

## 2015-07-28 MED ORDER — LEVETIRACETAM ER 500 MG PO TB24: 2000.0000 mg | ORAL_TABLET | Freq: Every day | ORAL | Status: DC

## 2015-07-28 NOTE — Telephone Encounter (Signed)
Please let mom know that Keppra level was therapeutic when checked last week. Increase to 4 capsules daily. Will phone into the local pharmacy

## 2015-07-28 NOTE — Telephone Encounter (Signed)
Spoke to mother, Irving Burton and gave her the result note that keppra level was good.   Increase keppra to  po daily.  Relayed that rx to local pharmacy.  She verbalized understanding.

## 2015-07-29 NOTE — Progress Notes (Signed)
I reviewed note and agree with plan.   Jaice Lague R. Jos Cygan, MD  Certified in Neurology, Neurophysiology and Neuroimaging  [facility] [address], [address] [place],  [address] [phone]   

## 2015-11-10 ENCOUNTER — Ambulatory Visit: Payer: Self-pay | Admitting: Nurse Practitioner

## 2015-11-11 ENCOUNTER — Encounter: Payer: Self-pay | Admitting: Nurse Practitioner

## 2015-11-11 ENCOUNTER — Ambulatory Visit (INDEPENDENT_AMBULATORY_CARE_PROVIDER_SITE_OTHER): Payer: Managed Care, Other (non HMO) | Admitting: Nurse Practitioner

## 2015-11-11 VITALS — BP 140/93 | HR 77 | Ht 74.0 in | Wt 246.0 lb

## 2015-11-11 DIAGNOSIS — Z5181 Encounter for therapeutic drug level monitoring: Secondary | ICD-10-CM

## 2015-11-11 DIAGNOSIS — G40909 Epilepsy, unspecified, not intractable, without status epilepticus: Secondary | ICD-10-CM

## 2015-11-11 DIAGNOSIS — G40209 Localization-related (focal) (partial) symptomatic epilepsy and epileptic syndromes with complex partial seizures, not intractable, without status epilepticus: Secondary | ICD-10-CM | POA: Diagnosis not present

## 2015-11-11 MED ORDER — LEVETIRACETAM ER 500 MG PO TB24: 2500.0000 mg | ORAL_TABLET | Freq: Every day | ORAL | Status: DC

## 2015-11-11 NOTE — Patient Instructions (Signed)
Increase Keppra to 5 caps daily 2500mg   Will get Keppra level in 10 days after increase Call for any seizure activity Return in 2 months

## 2015-11-11 NOTE — Progress Notes (Signed)
[facility]  PATIENT: Zachary [name] DOB: 11/15/1990   REASON FOR VISIT: Follow-up for seizure disorder, recent seizure on 11/08/2015 HISTORY FROM: Patient    HISTORY OF PRESENT ILLNESS: UPDATE 11/11/15 CMMr. Zachary [name], 25 year old male returns for follow-up. He has a history of seizure disorder. On 11/08/2015 he was found on the floor by his mom,  no missed doses of med; he bit his tongue, did not go to ED. He did not have any incontinence. He does not remember the event. He is currently on Keppra extended release 2000 mg daily. His last Keppra level in January was 11.6. He says that he was under a lot of stress when the seizure happened, his aunt dying suddenly and he is in finals at [employer]. Both of his recent seizures happened during sleep He returns for reevaluation  07/25/15 CMMr. Zachary [name], 25 year old male returns for followup. He has a history of seizure disorder with last seizure occurring 07/11/15 and then previously 10/26/2010. His most recent seizure occurred in the setting of vital illness, it occurred during sleep and he awakened and had bitten his tongue. No incontinence.CT of the head on 07/2014 without acute intracranial abnormality He is currently on Keppra extended release 500 mg 3 daily without side effects. Keppra level has been within normal limits when last checked. He returns for his routine yearly evaluation. He needs refills on this medication. He denies missing any doses.   PRIOR HPI: right-handed male with no past medical history here for evaluation of seizure disorder. Patient had 3 seizures (Mar 2011, Mar 2012, Apr 2012). Symptoms start with right sided hearing loss, ringing in the ear, head turning to the right, then loss of consciousness. Some convulsions, tongue biting, but no incontinence. Patient has been in college at [employer], but now has moved back home. No history of head trauma or CNS infx. Has maternal cousin with childhood  sz, but she outgrew them.     REVIEW OF SYSTEMS: Full 14 system review of systems performed and notable only for those listed, all others are neg:  Constitutional: neg  Cardiovascular: neg Ear/Nose/Throat: neg  Skin: neg Eyes: neg Respiratory: neg Gastroitestinal: neg  Hematology/Lymphatic: neg  Endocrine: neg Musculoskeletal: Muscle aches Allergy/Immunology: neg Neurological: Seizure on 11/08/2015 Psychiatric: neg Sleep : neg   ALLERGIES: No Known Allergies  HOME MEDICATIONS: Outpatient Prescriptions Prior to Visit  Medication Sig Dispense Refill  . acetaminophen (TYLENOL) 500 MG tablet Take 1,000 mg by mouth every 6 (six) hours as needed for mild pain.    Marland Kitchen. levETIRAcetam (KEPPRA XR) 500 MG 24 hr tablet Take 4 tablets (2,000 mg total) by mouth daily. 120 tablet 6   No facility-administered medications prior to visit.    PAST MEDICAL HISTORY: Past Medical History  Diagnosis Date  . Seizure disorder (HCC) 2011    [facility] and [facility] WNL  . History of chicken pox   . Smoker     PAST SURGICAL HISTORY: Past Surgical History  Procedure Laterality Date  . Appendectomy  2004    FAMILY HISTORY: Family History  Problem Relation Age of Onset  . Cancer Maternal Aunt     breast  . Cancer Maternal Grandmother     breast  . Cancer Paternal Grandmother     breast  . Cancer Maternal Aunt 55    colon  . CAD Neg Hx   . Stroke Neg Hx   . Diabetes Neg Hx   . Hypertension Neg Hx   .  High blood pressure Father     SOCIAL HISTORY: Social History   Social History  . Marital Status: Single    Spouse Name: N/A  . Number of Children: 0  . Years of Education: college   Occupational History  .      Not working   Social History Main Topics  . Smoking status: Current Every Day Smoker -- 0.50 packs/day    Types: Cigarettes  . Smokeless tobacco: Never Used     Comment: 3-4 cigarettes daily, 11/11/15 smoking 1/2 ppd  . Alcohol Use: 0.0 oz/week    0 Standard  drinks or equivalent per week     Comment: Occasional (weekends) 10 per sitting  . Drug Use: Yes    Special: Marijuana     Comment: 11/11/15 pt denies  . Sexual Activity: Not on file   Other Topics Concern  . Not on file   Social History Narrative   Caffeine: 1 cup/day   Lives with mother and father, has 1 dog.  1 older brother   Education full time Archivist.      Activity: golfs regularly   Diet: good water, fruits/vegetables some   Patient is single.           PHYSICAL EXAM  Filed Vitals:   11/11/15 1128  BP: 140/93  Pulse: 77  Height: 6\' 2"  (1.88 m)  Weight: 246 lb (111.585 kg)   Body mass index is 31.57 kg/(m^2). Generalized: Well developed, obese male in no acute distress Well groomed Head: normocephalic and atraumatic,. Oropharynx benign . Mallopatti 3 Neck: Supple, no carotid bruits Neck size 18.  Cardiac: Regular rate rhythm, no murmur  Musculoskeletal: No deformity   Neurological examination   Mentation: Alert oriented to time, place, history taking. Attention span and concentration appropriate. Recent and remote memory intact. Follows all commands speech and language fluent.ESS 6, FSS 25.    Cranial nerve II-XII: Fundoscopic exam reveals sharp disc margins.Pupils were equal round reactive to light extraocular movements were full, visual field were full on confrontational test. Facial sensation and strength were normal. hearing was intact to finger rubbing bilaterally. Uvula tongue midline. head turning and shoulder shrug were normal and symmetric.Tongue protrusion into cheek strength was normal. Motor: normal bulk and tone, full strength in the BUE, BLE, fine finger movements normal, no pronator drift. No focal weakness Sensory: normal and symmetric to light touch, pinprick, and Vibration, proprioception  Coordination: finger-nose-finger, heel-to-shin bilaterally, no dysmetria Reflexes: Brachioradialis 2/2, biceps 2/2, triceps 2/2, patellar 2/2,  Achilles 2/2, plantar responses were flexor bilaterally. Gait and Station: Rising up from seated position without assistance, normal stance, moderate stride, good arm swing, smooth turning, able to perform tiptoe, and heel walking without difficulty. Tandem gait is steady  DIAGNOSTIC DATA (LABS, IMAGING, TESTING) - I reviewed patient records, labs, notes, testing and imaging myself where available.  Lab Results  Component Value Date   WBC 9.7 07/25/2015   HGB 16.0 07/24/2014   HCT 46.7 07/25/2015   MCV 90 07/25/2015   PLT 336 07/25/2015      Component Value Date/Time   NA 141 07/25/2015 1157   NA 137 07/24/2014 0820   K 4.7 07/25/2015 1157   CL 102 07/25/2015 1157   CO2 25 07/25/2015 1157   GLUCOSE 103* 07/25/2015 1157   GLUCOSE 140* 07/24/2014 0820   BUN 12 07/25/2015 1157   BUN 11 07/24/2014 0820   CREATININE 0.89 07/25/2015 1157   CALCIUM 9.9 07/25/2015 1157   PROT 7.0  07/25/2015 1157   ALBUMIN 4.6 07/25/2015 1157   AST 18 07/25/2015 1157   ALT 31 07/25/2015 1157   ALKPHOS 93 07/25/2015 1157   BILITOT 0.3 07/25/2015 1157   GFRNONAA 120 07/25/2015 1157   GFRAA 138 07/25/2015 1157    ASSESSMENT AND PLAN  25 y.o. year old male  has a past medical history of Seizure disorder (HCC) (2011)With recent seizure event while sleeping. He didt bite his tongue but no incontinence.ESS 6. FSS 25. The patient is a current patient of Dr. Marjory Lies  who is out of the office today . This note is sent to the work in doctor.    PLAN: Increase Keppra to 5 caps daily   Will get Keppra level in 10 days after increase Call for any seizure activity Return in 2 months Nilda Riggs, [facility], [facility], APRN  [facility] [address], [address] [place], [place] [address] [phone]

## 2015-11-12 NOTE — Progress Notes (Signed)
I agree with the evaluation and plan detailed above by Nancy Carolyn Martin, NP. 

## 2015-11-21 ENCOUNTER — Other Ambulatory Visit (INDEPENDENT_AMBULATORY_CARE_PROVIDER_SITE_OTHER): Payer: Self-pay

## 2015-11-21 DIAGNOSIS — Z0289 Encounter for other administrative examinations: Secondary | ICD-10-CM

## 2015-11-21 DIAGNOSIS — G40209 Localization-related (focal) (partial) symptomatic epilepsy and epileptic syndromes with complex partial seizures, not intractable, without status epilepticus: Secondary | ICD-10-CM

## 2015-11-21 DIAGNOSIS — Z5181 Encounter for therapeutic drug level monitoring: Secondary | ICD-10-CM

## 2015-11-21 DIAGNOSIS — G40909 Epilepsy, unspecified, not intractable, without status epilepticus: Secondary | ICD-10-CM

## 2015-11-24 ENCOUNTER — Telehealth: Payer: Self-pay | Admitting: *Deleted

## 2015-11-24 LAB — LEVETIRACETAM LEVEL: Levetiracetam Lvl: 31.6 ug/mL (ref 10.0–40.0)

## 2015-11-24 NOTE — Telephone Encounter (Signed)
-----   Message from Butch PennyMegan Millikan, NP sent at 11/24/2015  9:20 AM EDT ----- Lab work ok. Please call patient. Please ensure that he has not had any additional seizures events.

## 2015-11-24 NOTE — Telephone Encounter (Signed)
I LMVM for pt that labs ok, if he has had anymore seizure events he is to call me back.

## 2016-01-13 ENCOUNTER — Encounter: Payer: Self-pay | Admitting: Nurse Practitioner

## 2016-01-13 ENCOUNTER — Ambulatory Visit (INDEPENDENT_AMBULATORY_CARE_PROVIDER_SITE_OTHER): Payer: Managed Care, Other (non HMO) | Admitting: Nurse Practitioner

## 2016-01-13 VITALS — BP 140/96 | HR 76 | Resp 14 | Ht 73.0 in | Wt 251.8 lb

## 2016-01-13 DIAGNOSIS — G40209 Localization-related (focal) (partial) symptomatic epilepsy and epileptic syndromes with complex partial seizures, not intractable, without status epilepticus: Secondary | ICD-10-CM | POA: Diagnosis not present

## 2016-01-13 DIAGNOSIS — G44309 Post-traumatic headache, unspecified, not intractable: Secondary | ICD-10-CM

## 2016-01-13 DIAGNOSIS — G40909 Epilepsy, unspecified, not intractable, without status epilepticus: Secondary | ICD-10-CM

## 2016-01-13 DIAGNOSIS — Z5181 Encounter for therapeutic drug level monitoring: Secondary | ICD-10-CM | POA: Diagnosis not present

## 2016-01-13 MED ORDER — LEVETIRACETAM ER 500 MG PO TB24: 2500.0000 mg | ORAL_TABLET | Freq: Every day | ORAL | Status: DC

## 2016-01-13 NOTE — Progress Notes (Signed)
[facility]  PATIENT: Zachary [name] DOB: 17-Feb-1991   REASON FOR VISIT: Follow-up for seizure disorder HISTORY FROM: Patient    HISTORY OF PRESENT ILLNESS:UPDATE 07/11/2017CM Zachary [name], 25 year old male returns for follow-up. He has a history of seizure disorder with last seizure occurring on 11/08/2015 during sleep. Keppra dose was increased to 2500 mg at that time and repeat Keppra level 10 days later was 31.6. He was under a lot of stress when the seizure occurred, he was in finals at [employer] and a relative died. He returns for reevaluation. He reports today that grandfather was discharged from the service due to seizures.  UPDATE 11/11/15 CMMr. [name], 25 year old male returns for follow-up. He has a history of seizure disorder. On 11/08/2015 he was found on the floor by his mom, no missed doses of med; he bit his tongue, did not go to ED. He did not have any incontinence. He does not remember the event. He is currently on Keppra extended release 2000 mg daily. His last Keppra level in January was 11.6. He says that he was under a lot of stress when the seizure happened, his aunt dying suddenly and he is in finals at [employer]. Both of his recent seizures happened during sleep He returns for reevaluation  07/25/15 CMMr. [name], 25 year old male returns for followup. He has a history of seizure disorder with last seizure occurring 07/11/15 and then previously 10/26/2010. His most recent seizure occurred in the setting of vital illness, it occurred during sleep and he awakened and had bitten his tongue. No incontinence.CT of the head on 07/2014 without acute intracranial abnormality He is currently on Keppra extended release 500 mg 3 daily without side effects. Keppra level has been within normal limits when last checked. He returns for his routine yearly evaluation. He needs refills on this medication. He denies missing any doses.   PRIOR HPI: right-handed male with no past  medical history here for evaluation of seizure disorder. Patient had 3 seizures (Mar 2011, Mar 2012, Apr 2012). Symptoms start with right sided hearing loss, ringing in the ear, head turning to the right, then loss of consciousness. Some convulsions, tongue biting, but no incontinence. Patient has been in college at [employer], but now has moved back home. No history of head trauma or CNS infx. Has maternal cousin with childhood sz, but she outgrew them.    REVIEW OF SYSTEMS: Full 14 system review of systems performed and notable only for those listed, all others are neg:  Constitutional: neg  Cardiovascular: neg Ear/Nose/Throat: neg  Skin: neg Eyes: neg Respiratory: neg Gastroitestinal: neg  Hematology/Lymphatic: neg  Endocrine: neg Musculoskeletal:neg Allergy/Immunology: neg Neurological: neg Psychiatric: neg Sleep : neg   ALLERGIES: No Known Allergies  HOME MEDICATIONS: Outpatient Prescriptions Prior to Visit  Medication Sig Dispense Refill  . acetaminophen (TYLENOL) 500 MG tablet Take 1,000 mg by mouth every 6 (six) hours as needed for mild pain.    Marland Kitchen ibuprofen (ADVIL,MOTRIN) 200 MG tablet Take 200 mg by mouth every 6 (six) hours as needed.    . levETIRAcetam (KEPPRA XR) 500 MG 24 hr tablet Take 5 tablets (2,500 mg total) by mouth daily. 150 tablet 3   No facility-administered medications prior to visit.    PAST MEDICAL HISTORY: Past Medical History  Diagnosis Date  . Seizure disorder (HCC) 2011    [facility] at [facility], MRI and EEG WNL  . History of chicken pox   . Smoker  PAST SURGICAL HISTORY: Past Surgical History  Procedure Laterality Date  . Appendectomy  2004    FAMILY HISTORY: Family History  Problem Relation Age of Onset  . Cancer Maternal Aunt     breast  . Cancer Maternal Grandmother     breast  . Cancer Paternal Grandmother     breast  . Cancer Maternal Aunt 55    colon  . CAD Neg Hx   . Stroke Neg Hx   . Diabetes Neg Hx    . Hypertension Neg Hx   . High blood pressure Father     SOCIAL HISTORY: Social History   Social History  . Marital Status: Single    Spouse Name: N/A  . Number of Children: 0  . Years of Education: college   Occupational History  .      Not working   Social History Main Topics  . Smoking status: Current Every Day Smoker -- 0.50 packs/day    Types: Cigarettes  . Smokeless tobacco: Never Used     Comment: 3-4 cigarettes daily, 11/11/15 smoking 1/2 ppd  . Alcohol Use: 0.0 oz/week    0 Standard drinks or equivalent per week     Comment: Occasional (weekends) 10 per sitting  . Drug Use: Yes    Special: Marijuana     Comment: 11/11/15 pt denies  . Sexual Activity: Not on file   Other Topics Concern  . Not on file   Social History Narrative   Caffeine: 1 cup/day   Lives with mother and father, has 1 dog.  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      Activity: golfs regularly   Diet: good water, fruits/vegetables some   Patient is single.           PHYSICAL EXAM  Filed Vitals:   01/13/16 1341  BP: 140/96  Pulse: 76  Resp: 14  Height: 6\' 1"  (1.854 m)  Weight: 251 lb 12.8 oz (114.216 kg)   Body mass index is 33.23 kg/(m^2).  Generalized: Well developed, obese male in no acute distress Well groomed Head: normocephalic and atraumatic,. Oropharynx benign .  Neck: Supple, no carotid bruits   Cardiac: Regular rate rhythm, no murmur  Musculoskeletal: No deformity   Neurological examination   Mentation: Alert oriented to time, place, history taking. Attention span and concentration appropriate. Recent and remote memory intact. Follows all commands speech and language fluent. Cranial nerve II-XII: Fundoscopic exam reveals sharp disc margins.Pupils were equal round reactive to light extraocular movements were full, visual field were full on confrontational test. Facial sensation and strength were normal. hearing was intact to finger rubbing  bilaterally. Uvula tongue midline. head turning and shoulder shrug were normal and symmetric.Tongue protrusion into cheek strength was normal. Motor: normal bulk and tone, full strength in the BUE, BLE, fine finger movements normal, no pronator drift. No focal weakness Sensory: normal and symmetric to light touch, pinprick, and Vibration, proprioception  Coordination: finger-nose-finger, heel-to-shin bilaterally, no dysmetria Reflexes: Brachioradialis 2/2, biceps 2/2, triceps 2/2, patellar 2/2, Achilles 2/2, plantar responses were flexor bilaterally. Gait and Station: Rising up from seated position without assistance, normal stance, moderate stride, good arm swing, smooth turning, able to perform tiptoe, and heel walking without difficulty. Tandem gait is steady DIAGNOSTIC DATA (LABS, IMAGING, TESTING) - I reviewed patient records, labs, notes, testing and imaging myself where available.  Lab Results  Component Value Date   WBC 9.7 07/25/2015   HGB 16.0 07/24/2014   HCT  46.7 07/25/2015   MCV 90 07/25/2015   PLT 336 07/25/2015      Component Value Date/Time   NA 141 07/25/2015 1157   NA 137 07/24/2014 0820   K 4.7 07/25/2015 1157   CL 102 07/25/2015 1157   CO2 25 07/25/2015 1157   GLUCOSE 103* 07/25/2015 1157   GLUCOSE 140* 07/24/2014 0820   BUN 12 07/25/2015 1157   BUN 11 07/24/2014 0820   CREATININE 0.89 07/25/2015 1157   CALCIUM 9.9 07/25/2015 1157   PROT 7.0 07/25/2015 1157   ALBUMIN 4.6 07/25/2015 1157   AST 18 07/25/2015 1157   ALT 31 07/25/2015 1157   ALKPHOS 93 07/25/2015 1157   BILITOT 0.3 07/25/2015 1157   GFRNONAA 120 07/25/2015 1157   GFRAA 138 07/25/2015 1157    ASSESSMENT AND PLAN 25 y.o. year old male has a past medical history of Seizure disorder (HCC) (2011)With recent seizure event while sleeping in May.    Keppra  5 caps daily 2500mg  will refill Call for any seizure activity Please remember, common seizure triggers are: Sleep deprivation,  dehydration, overheating, stress, hypoglycemia or skipping meals, certain medications or excessive alcohol use, especially stopping alcohol abruptly if you have had heavy alcohol use before (aka alcohol withdrawal seizure). If you have a prolonged seizure over 2-5 minutes or back to back seizures, call or have someone call 911 or take you to the nearest emergency room. You cannot drive a car or operate any other machinery or vehicle within 6 months of a seizure. Please do not swim alone or take a tub bath for safety. Do not cook with large quantities of boiling water or oil for safety. Take your medicine for seizure prevention regularly and do not skip doses or stop medication abruptly and tone are told to do so by your healthcare provider. Try to get a refill on your antiepileptic medication ahead of time, so you are not at risk of running out. If you run out of the seizure medication and do not have a refill at hand she may run into medication withdrawal seizures. Avoid taking Wellbutrin, narcotic pain medications and tramadol, as they can lower seizure threshold.  Return in 6 months Nilda Riggs, [facility], [facility], APRN  [facility] [address], [address] [place], [place] [address] [phone]

## 2016-01-13 NOTE — Patient Instructions (Addendum)
Keppra to 5 caps daily 2500mg   Call for any seizure activity Please remember, common seizure triggers are: Sleep deprivation, dehydration, overheating, stress, hypoglycemia or skipping meals, certain medications or excessive alcohol use, especially stopping alcohol abruptly if you have had heavy alcohol use before (aka alcohol withdrawal seizure). If you have a prolonged seizure over 2-5 minutes or back to back seizures, call or have someone call 911 or take you to the nearest emergency room. You cannot drive a car or operate any other machinery or vehicle within 6 months of a seizure. Please do not swim alone or take a tub bath for safety. Do not cook with large quantities of boiling water or oil for safety. Take your medicine for seizure prevention regularly and do not skip doses or stop medication abruptly and tone are told to do so by your healthcare provider. Try to get a refill on your antiepileptic medication ahead of time, so you are not at risk of running out. If you run out of the seizure medication and do not have a refill at hand she may run into medication withdrawal seizures. Avoid taking Wellbutrin, narcotic pain medications and tramadol, as they can lower seizure threshold.  Return in 6 months

## 2016-01-14 NOTE — Progress Notes (Signed)
I reviewed note and agree with plan.   Nikhil Osei R. Eldean Klatt, MD  Certified in Neurology, Neurophysiology and Neuroimaging  [facility] [address], [address] [place], [place] [address] [phone]   

## 2016-01-23 ENCOUNTER — Ambulatory Visit: Payer: Managed Care, Other (non HMO) | Admitting: Nurse Practitioner

## 2016-07-15 ENCOUNTER — Encounter: Payer: Self-pay | Admitting: Nurse Practitioner

## 2016-07-15 ENCOUNTER — Ambulatory Visit (INDEPENDENT_AMBULATORY_CARE_PROVIDER_SITE_OTHER): Payer: Managed Care, Other (non HMO) | Admitting: Nurse Practitioner

## 2016-07-15 VITALS — BP 139/88 | HR 76 | Ht 73.0 in | Wt 275.4 lb

## 2016-07-15 DIAGNOSIS — Z5181 Encounter for therapeutic drug level monitoring: Secondary | ICD-10-CM | POA: Diagnosis not present

## 2016-07-15 DIAGNOSIS — G40909 Epilepsy, unspecified, not intractable, without status epilepticus: Secondary | ICD-10-CM | POA: Diagnosis not present

## 2016-07-15 DIAGNOSIS — G40209 Localization-related (focal) (partial) symptomatic epilepsy and epileptic syndromes with complex partial seizures, not intractable, without status epilepticus: Secondary | ICD-10-CM

## 2016-07-15 MED ORDER — LEVETIRACETAM ER 500 MG PO TB24: 2500.0000 mg | ORAL_TABLET | Freq: Every day | ORAL | 11 refills | Status: DC

## 2016-07-15 NOTE — Patient Instructions (Signed)
Keppra  5 caps daily 2500mg will refill Call for any seizure activity F/U in 1 year  

## 2016-07-15 NOTE — Progress Notes (Signed)
[facility]  PATIENT: Zachary Zachary [name] DOB: 11-22-1990   REASON FOR VISIT: Follow-up for seizure disorder HISTORY FROM: Patient    HISTORY OF PRESENT ILLNESS:UPDATE 01/11/2018CM Zachary Zachary [name], 26 year old Zachary [name] returns for follow-up with history of seizure disorder last seizure occurring 11/08/2015 during sleep. Keppra dose was increased to 2500 mg at that time with repeat Keppra level therapeutic after words. He has not had further seizure activity nor does he have side effects to his medication He is a Holiday representative in college. He returns for follow-up without further neurologic complaints  UPDATE 07/11/2017CM Zachary Zachary [name], 26 year old Zachary [name] returns for follow-up. He has a history of seizure disorder with last seizure occurring on 11/08/2015 during sleep. Keppra dose was increased to 2500 mg at that time and repeat Keppra level 10 days later was 31.6. He was under a lot of stress when the seizure occurred, he was in finals at [employer] and a relative died. He returns for reevaluation. He reports today that grandfather was discharged from the service due to seizures.  UPDATE 11/11/15 CMMr. Zachary Zachary [name], 26 year old Zachary [name] returns for follow-up. He has a history of seizure disorder. On 11/08/2015 he was found on the floor by his mom, no missed doses of med; he bit his tongue, did not go to ED. He did not have any incontinence. He does not remember the event. He is currently on Keppra extended release 2000 mg daily. His last Keppra level in January was 11.6. He says that he was under a lot of stress when the seizure happened, his aunt dying suddenly and he is in finals at [employer]. Both of his recent seizures happened during sleep He returns for reevaluation  07/25/15 CMMr. Zachary Zachary [name], 26 year old Zachary [name] returns for followup. He has a history of seizure disorder with last seizure occurring 07/11/15 and then previously 10/26/2010. His most recent seizure occurred in the setting of vital illness, it occurred during  sleep and he awakened and had bitten his tongue. No incontinence.CT of the head on 07/2014 without acute intracranial abnormality He is currently on Keppra extended release 500 mg 3 daily without side effects. Keppra level has been within normal limits when last checked. He returns for his routine yearly evaluation. He needs refills on this medication. He denies missing any doses.   PRIOR HPI: right-handed Zachary [name] with no past medical history here for evaluation of seizure disorder. Patient had 3 seizures (Mar 2011, Mar 2012, Apr 2012). Symptoms start with right sided hearing loss, ringing in the ear, head turning to the right, then loss of consciousness. Some convulsions, tongue biting, but no incontinence. Patient has been in college at [employer], but now has moved back home. No history of head trauma or CNS infx. Has maternal cousin with childhood sz, but she outgrew them.    REVIEW OF SYSTEMS: Full 14 system review of systems performed and notable only for those listed, all others are neg:  Constitutional: neg  Cardiovascular: neg Ear/Nose/Throat: neg  Skin: neg Eyes: neg Respiratory: neg Gastroitestinal: neg  Hematology/Lymphatic: neg  Endocrine: neg Musculoskeletal:neg Allergy/Immunology: neg Neurological: neg Psychiatric: neg Sleep : neg   ALLERGIES: No Known Allergies  HOME MEDICATIONS: Outpatient Medications Prior to Visit  Medication Sig Dispense Refill  . acetaminophen (TYLENOL) 500 MG tablet Take 1,000 mg by mouth every 6 (six) hours as needed for mild pain.    Marland Kitchen ibuprofen (ADVIL,MOTRIN) 200 MG tablet Take 200 mg by mouth every 6 (six) hours as needed.    . levETIRAcetam (KEPPRA  XR) 500 MG 24 hr tablet Take 5 tablets (2,500 mg total) by mouth daily. 150 tablet 7   No facility-administered medications prior to visit.     PAST MEDICAL HISTORY: Past Medical History:  Diagnosis Date  . History of chicken pox   . Seizure disorder (HCC) 2011    [facility]  . Smoker     PAST SURGICAL HISTORY: Past Surgical History:  Procedure Laterality Date  . APPENDECTOMY  2004    FAMILY HISTORY: Family History  Problem Relation Age of Onset  . High blood pressure Father   . Cancer Maternal Aunt     breast  . Cancer Maternal Grandmother     breast  . Cancer Paternal Grandmother     breast  . Cancer Maternal Aunt 55    colon  . CAD Neg Hx   . Stroke Neg Hx   . Diabetes Neg Hx   . Hypertension Neg Hx     SOCIAL HISTORY: Social History   Social History  . Marital status: Single    Spouse name: N/A  . Number of children: 0  . Years of education: college   Occupational History  .      Not working   Social History Main Topics  . Smoking status: Current Every Day Smoker    Packs/day: 0.50    Types: Cigarettes  . Smokeless tobacco: Never Used     Comment: 3-4 cigarettes daily, 11/11/15 smoking 1/2 ppd  . Alcohol use 0.0 oz/week     Comment: Occasional (weekends) 10 per sitting  . Drug use:     Types: Marijuana     Comment: 11/11/15 pt denies  . Sexual activity: Not on file   Other Topics Concern  . Not on file   Social History Narrative   Caffeine: 1 cup/day   Lives with mother and father, has 1 dog.  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      Activity: golfs regularly   Diet: good water, fruits/vegetables some   Patient is single.           PHYSICAL EXAM  Vitals:   07/15/16 1345  BP: 139/88  Pulse: 76  Weight: 275 lb 6.4 oz (124.9 kg)  Height: 6\' 1"  (1.854 m)   Body mass index is 36.33 kg/m.  Generalized: Well developed, obese Zachary [name] in no acute distress Well groomed Head: normocephalic and atraumatic,. Oropharynx benign .  Neck: Supple, no carotid bruits   Cardiac: Regular rate rhythm, no murmur  Musculoskeletal: No deformity   Neurological examination   Mentation: Alert oriented to time, place, history taking. Attention span and concentration  appropriate. Recent and remote memory intact. Follows all commands speech and language fluent. Cranial nerve II-XII: Pupils were equal round reactive to light extraocular movements were full, visual field were full on confrontational test. Facial sensation and strength were normal. hearing was intact to finger rubbing bilaterally. Uvula tongue midline. head turning and shoulder shrug were normal and symmetric.Tongue protrusion into cheek strength was normal. Motor: normal bulk and tone, full strength in the BUE, BLE, fine finger movements normal, no pronator drift. No focal weakness Sensory: normal and symmetric to light touch, pinprick, and Vibration,In the upper and lower extremities Coordination: finger-nose-finger, heel-to-shin bilaterally, no dysmetria Reflexes: Brachioradialis 2/2, biceps 2/2, triceps 2/2, patellar 2/2, Achilles 2/2, plantar responses were flexor bilaterally. Gait and Station: Rising up from seated position without assistance, normal stance, moderate stride, good arm  swing, smooth turning, able to perform tiptoe, and heel walking without difficulty. Tandem gait is steady DIAGNOSTIC DATA (LABS, IMAGING, TESTING) - I reviewed patient records, labs, notes, testing and imaging myself where available.  Lab Results  Component Value Date   WBC 9.7 07/25/2015   HGB 16.0 07/24/2014   HCT 46.7 07/25/2015   MCV 90 07/25/2015   PLT 336 07/25/2015      Component Value Date/Time   NA 141 07/25/2015 1157   K 4.7 07/25/2015 1157   CL 102 07/25/2015 1157   CO2 25 07/25/2015 1157   GLUCOSE 103 (H) 07/25/2015 1157   GLUCOSE 140 (H) 07/24/2014 0820   BUN 12 07/25/2015 1157   CREATININE 0.89 07/25/2015 1157   CALCIUM 9.9 07/25/2015 1157   PROT 7.0 07/25/2015 1157   ALBUMIN 4.6 07/25/2015 1157   AST 18 07/25/2015 1157   ALT 31 07/25/2015 1157   ALKPHOS 93 07/25/2015 1157   BILITOT 0.3 07/25/2015 1157   GFRNONAA 120 07/25/2015 1157   GFRAA 138 07/25/2015 1157    ASSESSMENT  AND PLAN Zachary Zachary [name] has a past medical history of Seizure disorder (HCC) (2011)With last  seizure event while sleeping in May 2017. Patient is currently on Keppra 2500 mg daily without side effects.. The patient is a current patient of Dr. Marjory Lies  who is out of the office today . This note is sent to the work in doctor.      PLAN:  Keppra  5 caps daily 2500mg  will refill Call for any seizure activity F/U in 1 year Nilda Riggs, [facility], [facility], APRN  [facility] [address] [place], [place] [address] [phone]

## 2016-07-25 NOTE — Progress Notes (Signed)
  Personally participated in and made any corrections needed to history, physical, neuro exam,assessment and plan as stated above and agree with plan.   Ardel Jagger, MD 

## 2017-07-04 ENCOUNTER — Other Ambulatory Visit: Payer: Self-pay | Admitting: Nurse Practitioner

## 2017-07-04 DIAGNOSIS — G40209 Localization-related (focal) (partial) symptomatic epilepsy and epileptic syndromes with complex partial seizures, not intractable, without status epilepticus: Secondary | ICD-10-CM

## 2017-07-04 DIAGNOSIS — Z5181 Encounter for therapeutic drug level monitoring: Secondary | ICD-10-CM

## 2017-07-04 DIAGNOSIS — G40909 Epilepsy, unspecified, not intractable, without status epilepticus: Secondary | ICD-10-CM

## 2017-07-11 ENCOUNTER — Encounter (INDEPENDENT_AMBULATORY_CARE_PROVIDER_SITE_OTHER): Payer: Self-pay

## 2017-07-11 ENCOUNTER — Ambulatory Visit: Payer: BLUE CROSS/BLUE SHIELD | Admitting: Family Medicine

## 2017-07-11 ENCOUNTER — Encounter: Payer: Self-pay | Admitting: Family Medicine

## 2017-07-11 VITALS — BP 130/82 | HR 86 | Temp 98.5°F | Wt 267.5 lb

## 2017-07-11 DIAGNOSIS — J029 Acute pharyngitis, unspecified: Secondary | ICD-10-CM

## 2017-07-11 DIAGNOSIS — F17200 Nicotine dependence, unspecified, uncomplicated: Secondary | ICD-10-CM | POA: Diagnosis not present

## 2017-07-11 LAB — POCT RAPID STREP A (OFFICE): Rapid Strep A Screen: NEGATIVE

## 2017-07-11 NOTE — Patient Instructions (Signed)
Can you take 3 ibuprofen every 8 hours for fever/pain, warm salt water gargles, Chloraseptic spray  Increase fluids, get extra rest    Steps to Quit Smoking Smoking tobacco can be harmful to your health and can affect almost every organ in your body. Smoking puts you, and those around you, at risk for developing many serious chronic diseases. Quitting smoking is difficult, but it is one of the best things that you can do for your health. It is never too late to quit. What are the benefits of quitting smoking? When you quit smoking, you lower your risk of developing serious diseases and conditions, such as:  Lung cancer or lung disease, such as COPD.  Heart disease.  Stroke.  Heart attack.  Infertility.  Osteoporosis and bone fractures.  Additionally, symptoms such as coughing, wheezing, and shortness of breath may get better when you quit. You may also find that you get sick less often because your body is stronger at fighting off colds and infections. If you are pregnant, quitting smoking can help to reduce your chances of having a baby of low birth weight. How do I get ready to quit? When you decide to quit smoking, create a plan to make sure that you are successful. Before you quit:  Pick a date to quit. Set a date within the next two weeks to give you time to prepare.  Write down the reasons why you are quitting. Keep this list in places where you will see it often, such as on your bathroom mirror or in your car or wallet.  Identify the people, places, things, and activities that make you want to smoke (triggers) and avoid them. Make sure to take these actions: ? Throw away all cigarettes at home, at work, and in your car. ? Throw away smoking accessories, such as Set designerashtrays and lighters. ? Clean your car and make sure to empty the ashtray. ? Clean your home, including curtains and carpets.  Tell your family, friends, and coworkers that you are quitting. Support from your loved  ones can make quitting easier.  Talk with your health care provider about your options for quitting smoking.  Find out what treatment options are covered by your health insurance.  What strategies can I use to quit smoking? Talk with your healthcare provider about different strategies to quit smoking. Some strategies include:  Quitting smoking altogether instead of gradually lessening how much you smoke over a period of time. Research shows that quitting "cold [place]key" is more successful than gradually quitting.  Attending in-person counseling to help you build problem-solving skills. You are more likely to have success in quitting if you attend several counseling sessions. Even short sessions of 10 minutes can be effective.  Finding resources and support systems that can help you to quit smoking and remain smoke-free after you quit. These resources are most helpful when you use them often. They can include: ? Online chats with a Veterinary surgeoncounselor. ? Telephone quitlines. ? Automotive engineerrinted self-help materials. ? Support groups or group counseling. ? Text messaging programs. ? Mobile phone applications.  Taking medicines to help you quit smoking. (If you are pregnant or breastfeeding, talk with your health care provider first.) Some medicines contain nicotine and some do not. Both types of medicines help with cravings, but the medicines that include nicotine help to relieve withdrawal symptoms. Your health care provider may recommend: ? Nicotine patches, gum, or lozenges. ? Nicotine inhalers or sprays. ? Non-nicotine medicine that is taken by mouth.  Talk with your health care provider about combining strategies, such as taking medicines while you are also receiving in-person counseling. Using these two strategies together makes you more likely to succeed in quitting than if you used either strategy on its own. If you are pregnant or breastfeeding, talk with your health care provider about finding counseling  or other support strategies to quit smoking. Do not take medicine to help you quit smoking unless told to do so by your health care provider. What things can I do to make it easier to quit? Quitting smoking might feel overwhelming at first, but there is a lot that you can do to make it easier. Take these important actions:  Reach out to your family and friends and ask that they support and encourage you during this time. Call telephone quitlines, reach out to support groups, or work with a counselor for support.  Ask people who smoke to avoid smoking around you.  Avoid places that trigger you to smoke, such as bars, parties, or smoke-break areas at work.  Spend time around people who do not smoke.  Lessen stress in your life, because stress can be a smoking trigger for some people. To lessen stress, try: ? Exercising regularly. ? Deep-breathing exercises. ? Yoga. ? Meditating. ? Performing a body scan. This involves closing your eyes, scanning your body from head to toe, and noticing which parts of your body are particularly tense. Purposefully relax the muscles in those areas.  Download or purchase mobile phone or tablet apps (applications) that can help you stick to your quit plan by providing reminders, tips, and encouragement. There are many free apps, such as QuitGuide from the [employer] developer for Disease Control and Prevention). You can find other support for quitting smoking (smoking cessation) through smokefree.gov and other websites.  How will I feel when I quit smoking? Within the first 24 hours of quitting smoking, you may start to feel some withdrawal symptoms. These symptoms are usually most noticeable 2-3 days after quitting, but they usually do not last beyond 2-3 weeks. Changes or symptoms that you might experience include:  Mood swings.  Restlessness, anxiety, or irritation.  Difficulty concentrating.  Dizziness.  Strong cravings for sugary foods in addition to  nicotine.  Mild weight gain.  Constipation.  Nausea.  Coughing or a sore throat.  Changes in how your medicines work in your body.  A depressed mood.  Difficulty sleeping (insomnia).  After the first 2-3 weeks of quitting, you may start to notice more positive results, such as:  Improved sense of smell and taste.  Decreased coughing and sore throat.  Slower heart rate.  Lower blood pressure.  Clearer skin.  The ability to breathe more easily.  Fewer sick days.  Quitting smoking is very challenging for most people. Do not get discouraged if you are not successful the first time. Some people need to make many attempts to quit before they achieve long-term success. Do your best to stick to your quit plan, and talk with your health care provider if you have any questions or concerns. This information is not intended to replace advice given to you by your health care provider. Make sure you discuss any questions you have with your health care provider. Document Released: 06/15/2001 Document Revised: 02/17/2016 Document Reviewed: 11/05/2014 Elsevier Interactive Patient Education  [employer].

## 2017-07-11 NOTE — Progress Notes (Signed)
Subjective:    Patient ID: Zachary [name], male    DOB: 11/23/1990, 27 y.o.   MRN: [id number]  HPI This is a 27 yo male who presents today with cough and sore throat x 3 days. Has had some chills. Throat dry, some nasal congestion, pain with swallowing. Feels pretty well otherwise, cough mild, no SOB, some wheeze in am after awakening. No headache, some ear pressure.  No known sick contacts. Has taken ibuprofen 400 mg with improvement of pain. None today.   Smokes 1/2 ppd, interested in quitting, has not had success with patches or gum, not interested in Chantix.   Past Medical History:  Diagnosis Date  . History of chicken pox   . Seizure disorder (HCC) 2011   [facility]  . Smoker    Past Surgical History:  Procedure Laterality Date  . APPENDECTOMY  2004   Family History  Problem Relation Age of Onset  . High blood pressure Father   . Cancer Maternal Aunt        breast  . Cancer Maternal Grandmother        breast  . Cancer Paternal Grandmother        breast  . Cancer Maternal Aunt 55       colon  . CAD Neg Hx   . Stroke Neg Hx   . Diabetes Neg Hx   . Hypertension Neg Hx    Social History   Tobacco Use  . Smoking status: Current Every Day Smoker    Packs/day: 0.50    Types: Cigarettes  . Smokeless tobacco: Never Used  . Tobacco comment: 3-4 cigarettes daily, 11/11/15 smoking 1/2 ppd  Substance Use Topics  . Alcohol use: Yes    Alcohol/week: 0.0 oz    Comment: Occasional (weekends) 10 per sitting  . Drug use: Yes    Types: Marijuana    Comment: 11/11/15 pt denies      Review of Systems Per HPI    Objective:   Physical Exam  Constitutional: He is oriented to person, place, and time. He appears well-developed and well-nourished. No distress.  HENT:  Head: Normocephalic and atraumatic.  Right Ear: Tympanic membrane, external ear and ear canal normal.  Left Ear: Tympanic membrane, external ear and ear canal normal.  Nose: Nose normal.   Mouth/Throat: Uvula is midline. Posterior oropharyngeal erythema present. No oropharyngeal exudate, posterior oropharyngeal edema or tonsillar abscesses.  Eyes: Conjunctivae are normal.  Neck: Normal range of motion. Neck supple.  Cardiovascular: Normal rate, regular rhythm and normal heart sounds.  Pulmonary/Chest: Effort normal and breath sounds normal.  Lymphadenopathy:    He has no cervical adenopathy.  Neurological: He is alert and oriented to person, place, and time.  Skin: Skin is warm and dry. He is not diaphoretic.  Psychiatric: He has a normal mood and affect. His behavior is normal. Judgment and thought content normal.  Vitals reviewed.    BP 130/82 (BP Location: Right Arm, Patient Position: Sitting)   Pulse 86   Temp 98.5 F (36.9 C) (Oral)   Wt 267 lb 8 oz (121.3 kg)   SpO2 97%   BMI 35.29 kg/m  Wt Readings from Last 3 Encounters:  07/11/17 267 lb 8 oz (121.3 kg)  07/15/16 275 lb 6.4 oz (124.9 kg)  01/13/16 251 lb 12.8 oz (114.2 kg)    Results for orders placed or performed in visit on 07/11/17  POCT rapid strep A  Result Value Ref  Range   Rapid Strep A Screen Negative Negative       Assessment & Plan:   1. Sore throat - POCT rapid strep A - likely viral - Provided written and verbal information regarding diagnosis and treatment. - RTC/ER precautions reviewed  2. Smoker - encouraged cessation and provided written tips for success   Olean Ree, FNP-BC  [facility], [facility]  07/11/2017 2:24 PM

## 2017-07-18 NOTE — Progress Notes (Signed)
[facility]  PATIENT: Zachary [name] DOB: May 02, 1991   REASON FOR VISIT: Follow-up for seizure disorder HISTORY FROM: Patient    HISTORY OF PRESENT ILLNESS:UPDATE 1/15/2019CM [name], 27 year old male returns for follow-up with history of seizure disorder.  His last seizure occurred in May 2017 during sleep.  Keppra dose is currently at 2500 extended release daily.  He has not had further seizure activity since that time.  He graduated from college and is working full-time.  He expresses a desire to quit smoking.  He returns for follow-up without neurologic complaints  UPDATE 01/11/2018CM [name], 27 year old male returns for follow-up with history of seizure disorder last seizure occurring 11/08/2015 during sleep. Keppra dose was increased to 2500 mg at that time with repeat Keppra level therapeutic after words. He has not had further seizure activity nor does he have side effects to his medication He is a Holiday representative in college. He returns for follow-up without further neurologic complaints  UPDATE 07/11/2017CM [name], 27 year old male returns for follow-up. He has a history of seizure disorder with last seizure occurring on 11/08/2015 during sleep. Keppra dose was increased to 2500 mg at that time and repeat Keppra level 10 days later was 31.6. He was under a lot of stress when the seizure occurred, he was in finals at [employer] and a relative died. He returns for reevaluation. He reports today that grandfather was discharged from the service due to seizures.  UPDATE 11/11/15 CMMr. [name], 27 year old male returns for follow-up. He has a history of seizure disorder. On 11/08/2015 he was found on the floor by his mom, no missed doses of med; he bit his tongue, did not go to ED. He did not have any incontinence. He does not remember the event. He is currently on Keppra extended release 2000 mg daily. His last Keppra level in January was 11.6. He says that he was under a  lot of stress when the seizure happened, his aunt dying suddenly and he is in finals at [employer]. Both of his recent seizures happened during sleep He returns for reevaluation  07/25/15 CMMr. [name], 27 year old male returns for followup. He has a history of seizure disorder with last seizure occurring 07/11/15 and then previously 10/26/2010. His most recent seizure occurred in the setting of vital illness, it occurred during sleep and he awakened and had bitten his tongue. No incontinence.CT of the head on 07/2014 without acute intracranial abnormality He is currently on Keppra extended release 500 mg 3 daily without side effects. Keppra level has been within normal limits when last checked. He returns for his routine yearly evaluation. He needs refills on this medication. He denies missing any doses.   PRIOR HPI: right-handed male with no past medical history here for evaluation of seizure disorder. Patient had 3 seizures (Mar 2011, Mar 2012, Apr 2012). Symptoms start with right sided hearing loss, ringing in the ear, head turning to the right, then loss of consciousness. Some convulsions, tongue biting, but no incontinence. Patient has been in college at [employer], but now has moved back home. No history of head trauma or CNS infx. Has maternal cousin with childhood sz, but she outgrew them.    REVIEW OF SYSTEMS: Full 14 system review of systems performed and notable only for those listed, all others are neg:  Constitutional: neg  Cardiovascular: neg Ear/Nose/Throat: neg  Skin: neg Eyes: neg Respiratory: neg Gastroitestinal: neg  Hematology/Lymphatic: neg  Endocrine: neg Musculoskeletal:neg Allergy/Immunology: neg Neurological: neg  Psychiatric: neg Sleep : neg   ALLERGIES: No Known Allergies  HOME MEDICATIONS: Outpatient Medications Prior to Visit  Medication Sig Dispense Refill  . acetaminophen (TYLENOL) 500 MG tablet Take 1,000 mg by mouth every 6 (six) hours as  needed for mild pain.    Marland Kitchen. ibuprofen (ADVIL,MOTRIN) 200 MG tablet Take 200 mg by mouth every 6 (six) hours as needed.    . levETIRAcetam (KEPPRA XR) 500 MG 24 hr tablet TAKE FIVE (5) TABLETS BY MOUTH DAILY 150 tablet 0   No facility-administered medications prior to visit.     PAST MEDICAL HISTORY: Past Medical History:  Diagnosis Date  . History of chicken pox   . Seizure disorder (HCC) 2011   [facility]  . Smoker     PAST SURGICAL HISTORY: Past Surgical History:  Procedure Laterality Date  . APPENDECTOMY  2004    FAMILY HISTORY: Family History  Problem Relation Age of Onset  . High blood pressure Father   . Heart failure Father        CHF  . Cancer Maternal Aunt        breast  . Cancer Maternal Grandmother        breast  . Cancer Paternal Grandmother        breast  . Cancer Maternal Aunt 55       colon  . CAD Neg Hx   . Stroke Neg Hx   . Diabetes Neg Hx   . Hypertension Neg Hx     SOCIAL HISTORY: Social History   Socioeconomic History  . Marital status: Single    Spouse name: Not on file  . Number of children: 0  . Years of education: college  . Highest education level: Not on file  Social Needs  . Financial resource strain: Not on file  . Food insecurity - worry: Not on file  . Food insecurity - inability: Not on file  . Transportation needs - medical: Not on file  . Transportation needs - non-medical: Not on file  Occupational History    Comment: Not working  Tobacco Use  . Smoking status: Current Every Day Smoker    Packs/day: 0.50    Types: Cigarettes  . Smokeless tobacco: Never Used  . Tobacco comment: 3-4 cigarettes daily, 07/19/17 smoking 1/2 ppd  Substance and Sexual Activity  . Alcohol use: Yes    Alcohol/week: 0.0 oz    Comment: Occasional (weekends) 10 per sitting  . Drug use: Yes    Types: Marijuana    Comment: 11/11/15 pt denies  . Sexual activity: Not on file  Other Topics Concern  . Not on file  Social  History Narrative   Caffeine: 1 cup/day   Lives with mother and father, has 1 dog.  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      Activity: golfs regularly   Diet: good water, fruits/vegetables some   Patient is single.        PHYSICAL EXAM  Vitals:   07/19/17 1324  BP: (!) 153/88  Pulse: 73  Weight: 271 lb (122.9 kg)   Body mass index is 35.75 kg/m.  Generalized: Well developed, obese male in no acute distress Well groomed Head: normocephalic and atraumatic,. Oropharynx benign .  Neck: Supple,   Musculoskeletal: No deformity   Neurological examination   Mentation: Alert oriented to time, place, history taking. Attention span and concentration appropriate. Recent and remote memory intact. Follows all commands speech  and language fluent. Cranial nerve II-XII: Pupils were equal round reactive to light extraocular movements were full, visual field were full on confrontational test. Facial sensation and strength were normal. hearing was intact to finger rubbing bilaterally. Uvula tongue midline. head turning and shoulder shrug were normal and symmetric.Tongue protrusion into cheek strength was normal. Motor: normal bulk and tone, full strength in the BUE, BLE, fine finger movements normal, no pronator drift. No focal weakness Sensory: normal and symmetric to light touch, pinprick, and Vibration,In the upper and lower extremities  Coordination: finger-nose-finger, heel-to-shin bilaterally, no dysmetria Reflexes: Brachioradialis 2/2, biceps 2/2, triceps 2/2, patellar 2/2, Achilles 2/2, plantar responses were flexor bilaterally. Gait and Station: Rising up from seated position without assistance, normal stance, moderate stride, good arm swing, smooth turning, able to perform tiptoe, and heel walking without difficulty. Tandem gait is steady DIAGNOSTIC DATA (LABS, IMAGING, TESTING) - I reviewed patient records, labs, notes, testing and imaging myself where  available.  Lab Results  Component Value Date   WBC 9.7 07/25/2015   HGB 15.8 07/25/2015   HCT 46.7 07/25/2015   MCV 90 07/25/2015   PLT 336 07/25/2015      Component Value Date/Time   NA 141 07/25/2015 1157   K 4.7 07/25/2015 1157   CL 102 07/25/2015 1157   CO2 25 07/25/2015 1157   GLUCOSE 103 (H) 07/25/2015 1157   GLUCOSE 140 (H) 07/24/2014 0820   BUN 12 07/25/2015 1157   CREATININE 0.89 07/25/2015 1157   CALCIUM 9.9 07/25/2015 1157   PROT 7.0 07/25/2015 1157   ALBUMIN 4.6 07/25/2015 1157   AST 18 07/25/2015 1157   ALT 31 07/25/2015 1157   ALKPHOS 93 07/25/2015 1157   BILITOT 0.3 07/25/2015 1157   GFRNONAA 120 07/25/2015 1157   GFRAA 138 07/25/2015 1157    ASSESSMENT AND PLAN 27 y.o. year old male has a past medical history of Seizure disorder (HCC) (2011)With last  seizure event while sleeping in May 2017. Patient is currently on Keppra 2500 mg daily without side effects.Marland Kitchen    PLAN:  Keppra  5 caps daily 2500mg  will refill Call for any seizure activity Patient has a desire to stop smoking, he was given information on smoking cessation and this was reviewed with him F/U in 1 year Nilda Riggs, [facility], [facility], APRN  [facility] [address] [place], [place] [address] [phone]

## 2017-07-19 ENCOUNTER — Ambulatory Visit: Payer: Managed Care, Other (non HMO) | Admitting: Nurse Practitioner

## 2017-07-19 ENCOUNTER — Encounter: Payer: Self-pay | Admitting: Nurse Practitioner

## 2017-07-19 VITALS — BP 153/88 | HR 73 | Wt 271.0 lb

## 2017-07-19 DIAGNOSIS — F17200 Nicotine dependence, unspecified, uncomplicated: Secondary | ICD-10-CM | POA: Diagnosis not present

## 2017-07-19 DIAGNOSIS — G40909 Epilepsy, unspecified, not intractable, without status epilepticus: Secondary | ICD-10-CM

## 2017-07-19 DIAGNOSIS — G40209 Localization-related (focal) (partial) symptomatic epilepsy and epileptic syndromes with complex partial seizures, not intractable, without status epilepticus: Secondary | ICD-10-CM | POA: Diagnosis not present

## 2017-07-19 MED ORDER — LEVETIRACETAM ER 500 MG PO TB24: ORAL_TABLET | ORAL | 11 refills | Status: DC

## 2017-07-19 NOTE — Progress Notes (Signed)
I reviewed note and agree with plan.   VIKRAM R. PENUMALLI, MD 07/19/2017, 4:55 PM Certified in Neurology, Neurophysiology and Neuroimaging  [facility] [address], [address] [place], [place] [address] [phone]  

## 2017-07-19 NOTE — Patient Instructions (Addendum)
Keppra  5 caps daily 2500mg  will refill Call for any seizure activity F/U in 1 year  Steps to Quit Smoking Smoking tobacco can be harmful to your health and can affect almost every organ in your body. Smoking puts you, and those around you, at risk for developing many serious chronic diseases. Quitting smoking is difficult, but it is one of the best things that you can do for your health. It is never too late to quit. What are the benefits of quitting smoking? When you quit smoking, you lower your risk of developing serious diseases and conditions, such as:  Lung cancer or lung disease, such as COPD.  Heart disease.  Stroke.  Heart attack.  Infertility.  Osteoporosis and bone fractures.  Additionally, symptoms such as coughing, wheezing, and shortness of breath may get better when you quit. You may also find that you get sick less often because your body is stronger at fighting off colds and infections. If you are pregnant, quitting smoking can help to reduce your chances of having a baby of low birth weight. How do I get ready to quit? When you decide to quit smoking, create a plan to make sure that you are successful. Before you quit:  Pick a date to quit. Set a date within the next two weeks to give you time to prepare.  Write down the reasons why you are quitting. Keep this list in places where you will see it often, such as on your bathroom mirror or in your car or wallet.  Identify the people, places, things, and activities that make you want to smoke (triggers) and avoid them. Make sure to take these actions: ? Throw away all cigarettes at home, at work, and in your car. ? Throw away smoking accessories, such as Set designerashtrays and lighters. ? Clean your car and make sure to empty the ashtray. ? Clean your home, including curtains and carpets.  Tell your family, friends, and coworkers that you are quitting. Support from your loved ones can make quitting easier.  Talk with your  health care provider about your options for quitting smoking.  Find out what treatment options are covered by your health insurance.  What strategies can I use to quit smoking? Talk with your healthcare provider about different strategies to quit smoking. Some strategies include:  Quitting smoking altogether instead of gradually lessening how much you smoke over a period of time. Research shows that quitting "cold [place]key" is more successful than gradually quitting.  Attending in-person counseling to help you build problem-solving skills. You are more likely to have success in quitting if you attend several counseling sessions. Even short sessions of 10 minutes can be effective.  Finding resources and support systems that can help you to quit smoking and remain smoke-free after you quit. These resources are most helpful when you use them often. They can include: ? Online chats with a Veterinary surgeoncounselor. ? Telephone quitlines. ? Automotive engineerrinted self-help materials. ? Support groups or group counseling. ? Text messaging programs. ? Mobile phone applications.  Taking medicines to help you quit smoking. (If you are pregnant or breastfeeding, talk with your health care provider first.) Some medicines contain nicotine and some do not. Both types of medicines help with cravings, but the medicines that include nicotine help to relieve withdrawal symptoms. Your health care provider may recommend: ? Nicotine patches, gum, or lozenges. ? Nicotine inhalers or sprays. ? Non-nicotine medicine that is taken by mouth.  Talk with your health care provider about  combining strategies, such as taking medicines while you are also receiving in-person counseling. Using these two strategies together makes you more likely to succeed in quitting than if you used either strategy on its own. If you are pregnant or breastfeeding, talk with your health care provider about finding counseling or other support strategies to quit smoking. Do  not take medicine to help you quit smoking unless told to do so by your health care provider. What things can I do to make it easier to quit? Quitting smoking might feel overwhelming at first, but there is a lot that you can do to make it easier. Take these important actions:  Reach out to your family and friends and ask that they support and encourage you during this time. Call telephone quitlines, reach out to support groups, or work with a counselor for support.  Ask people who smoke to avoid smoking around you.  Avoid places that trigger you to smoke, such as bars, parties, or smoke-break areas at work.  Spend time around people who do not smoke.  Lessen stress in your life, because stress can be a smoking trigger for some people. To lessen stress, try: ? Exercising regularly. ? Deep-breathing exercises. ? Yoga. ? Meditating. ? Performing a body scan. This involves closing your eyes, scanning your body from head to toe, and noticing which parts of your body are particularly tense. Purposefully relax the muscles in those areas.  Download or purchase mobile phone or tablet apps (applications) that can help you stick to your quit plan by providing reminders, tips, and encouragement. There are many free apps, such as QuitGuide from the [employer] Office manager for Disease Control and Prevention). You can find other support for quitting smoking (smoking cessation) through smokefree.gov and other websites.  How will I feel when I quit smoking? Within the first 24 hours of quitting smoking, you may start to feel some withdrawal symptoms. These symptoms are usually most noticeable 2-3 days after quitting, but they usually do not last beyond 2-3 weeks. Changes or symptoms that you might experience include:  Mood swings.  Restlessness, anxiety, or irritation.  Difficulty concentrating.  Dizziness.  Strong cravings for sugary foods in addition to nicotine.  Mild weight  gain.  Constipation.  Nausea.  Coughing or a sore throat.  Changes in how your medicines work in your body.  A depressed mood.  Difficulty sleeping (insomnia).  After the first 2-3 weeks of quitting, you may start to notice more positive results, such as:  Improved sense of smell and taste.  Decreased coughing and sore throat.  Slower heart rate.  Lower blood pressure.  Clearer skin.  The ability to breathe more easily.  Fewer sick days.  Quitting smoking is very challenging for most people. Do not get discouraged if you are not successful the first time. Some people need to make many attempts to quit before they achieve long-term success. Do your best to stick to your quit plan, and talk with your health care provider if you have any questions or concerns. This information is not intended to replace advice given to you by your health care provider. Make sure you discuss any questions you have with your health care provider. Document Released: 06/15/2001 Document Revised: 02/17/2016 Document Reviewed: 11/05/2014 Elsevier Interactive Patient Education  [employer].

## 2018-07-05 ENCOUNTER — Other Ambulatory Visit: Payer: Self-pay | Admitting: Nurse Practitioner

## 2018-07-05 DIAGNOSIS — G40209 Localization-related (focal) (partial) symptomatic epilepsy and epileptic syndromes with complex partial seizures, not intractable, without status epilepticus: Secondary | ICD-10-CM

## 2018-07-05 DIAGNOSIS — G40909 Epilepsy, unspecified, not intractable, without status epilepticus: Secondary | ICD-10-CM

## 2018-07-19 NOTE — Progress Notes (Signed)
[facility]  PATIENT: Zachary [name] DOB: 11-29-1990   REASON FOR VISIT: Follow-up for seizure disorder HISTORY FROM: Patient    HISTORY OF PRESENT ILLNESS:UPDATE 1/16/2020CM Mr. Avyukth, 28 year old male returns for follow-up with history of seizure disorder.  Last seizure occurred in May 2017.  He is currently on Keppra 2500 mg extended release daily.  He is now working full-time.  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  No interval medical issues.  He returns for reevaluation   UPDATE 1/15/2019CM Mr. Ayson, 28 year old male returns for follow-up with history of seizure disorder.  His last seizure occurred in May 2017 during sleep.  Keppra dose is currently at 2500 extended release daily.  He has not had further seizure activity since that time.  He graduated from college and is working full-time.  He expresses a desire to quit smoking.  He returns for follow-up without neurologic complaints  UPDATE 01/11/2018CM Mr. Yerton, 28 year old male returns for follow-up with history of seizure disorder last seizure occurring 11/08/2015 during sleep. Keppra dose was increased to 2500 mg at that time with repeat Keppra level therapeutic after words. He has not had further seizure activity nor does he have side effects to his medication He is a Holiday representative in college. He returns for follow-up without further neurologic complaints  UPDATE 07/11/2017CM Mr. Bobick, 28 year old male returns for follow-up. He has a history of seizure disorder with last seizure occurring on 11/08/2015 during sleep. Keppra dose was increased to 2500 mg at that time and repeat Keppra level 10 days later was 31.6. He was under a lot of stress when the seizure occurred, he was in finals at [employer] and a relative died. He returns for reevaluation. He reports today that grandfather was discharged from the service due to seizures.  UPDATE 11/11/15 CMMr. [name], 28 year old male returns for follow-up. He has a history of  seizure disorder. On 11/08/2015 he was found on the floor by his mom, no missed doses of med; he bit his tongue, did not go to ED. He did not have any incontinence. He does not remember the event. He is currently on Keppra extended release 2000 mg daily. His last Keppra level in January was 11.6. He says that he was under a lot of stress when the seizure happened, his aunt dying suddenly and he is in finals at [employer]. Both of his recent seizures happened during sleep He returns for reevaluation  07/25/15 CMMr. [name], 28 year old male returns for followup. He has a history of seizure disorder with last seizure occurring 07/11/15 and then previously 10/26/2010. His most recent seizure occurred in the setting of vital illness, it occurred during sleep and he awakened and had bitten his tongue. No incontinence.CT of the head on 07/2014 without acute intracranial abnormality He is currently on Keppra extended release 500 mg 3 daily without side effects. Keppra level has been within normal limits when last checked. He returns for his routine yearly evaluation. He needs refills on this medication. He denies missing any doses.   PRIOR HPI: right-handed male with no past medical history here for evaluation of seizure disorder. Patient had 3 seizures (Mar 2011, Mar 2012, Apr 2012). Symptoms start with right sided hearing loss, ringing in the ear, head turning to the right, then loss of consciousness. Some convulsions, tongue biting, but no incontinence. Patient has been in college at [employer], but now has moved back home. No history of head trauma or CNS infx. Has maternal cousin with childhood  sz, but she outgrew them.    REVIEW OF SYSTEMS: Full 14 system review of systems performed and notable only for those listed, all others are neg:  Constitutional: neg  Cardiovascular: neg Ear/Nose/Throat: neg  Skin: neg Eyes: neg Respiratory: neg Gastroitestinal: neg  Hematology/Lymphatic: neg    Endocrine: neg Musculoskeletal:neg Allergy/Immunology: neg Neurological: History of seizure disorder Psychiatric: neg Sleep : neg   ALLERGIES: No Known Allergies  HOME MEDICATIONS: Outpatient Medications Prior to Visit  Medication Sig Dispense Refill  . acetaminophen (TYLENOL) 500 MG tablet Take 1,000 mg by mouth every 6 (six) hours as needed for mild pain.    Marland Kitchen. ibuprofen (ADVIL,MOTRIN) 200 MG tablet Take 200 mg by mouth every 6 (six) hours as needed.    . levETIRAcetam (KEPPRA XR) 500 MG 24 hr tablet TAKE FIVE (5) TABLETS BY MOUTH DAILY 150 tablet 11   No facility-administered medications prior to visit.     PAST MEDICAL HISTORY: Past Medical History:  Diagnosis Date  . History of chicken pox   . Seizure disorder (HCC) 2011   [place] [facility], [facility] and EEG WNL  . Smoker     PAST SURGICAL HISTORY: Past Surgical History:  Procedure Laterality Date  . APPENDECTOMY  2004    FAMILY HISTORY: Family History  Problem Relation Age of Onset  . High blood pressure Father   . Heart failure Father        CHF  . Cancer Maternal Aunt        breast  . Cancer Maternal Grandmother        breast  . Cancer Paternal Grandmother        breast  . Cancer Maternal Aunt 55       colon  . CAD Neg Hx   . Stroke Neg Hx   . Diabetes Neg Hx   . Hypertension Neg Hx     SOCIAL HISTORY: Social History   Socioeconomic History  . Marital status: Single    Spouse name: Not on file  . Number of children: 0  . Years of education: college  . Highest education level: Not on file  Occupational History    Comment: Not working  Social Needs  . Financial resource strain: Not on file  . Food insecurity:    Worry: Not on file    Inability: Not on file  . Transportation needs:    Medical: Not on file    Non-medical: Not on file  Tobacco Use  . Smoking status: Current Every Day Smoker    Packs/day: 0.50    Types: Cigarettes  . Smokeless tobacco: Never Used  . Tobacco comment: 3-4  cigarettes daily, 07/19/17 smoking 1/2 ppd  Substance and Sexual Activity  . Alcohol use: Yes    Alcohol/week: 0.0 standard drinks    Comment: Occasional (weekends) 10 per sitting  . Drug use: Yes    Types: Marijuana    Comment: 11/11/15 pt denies  . Sexual activity: Not on file  Lifestyle  . Physical activity:    Days per week: Not on file    Minutes per session: Not on file  . Stress: Not on file  Relationships  . Social connections:    Talks on phone: Not on file    Gets together: Not on file    Attends religious service: Not on file    Active member of club or organization: Not on file    Attends meetings of clubs or organizations: Not on file  Relationship status: Not on file  . Intimate partner violence:    Fear of current or ex partner: Not on file    Emotionally abused: Not on file    Physically abused: Not on file    Forced sexual activity: Not on file  Other Topics Concern  . Not on file  Social History Narrative   Caffeine: 1 cup/day   Lives with mother and father, has 1 dog.  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      Activity: golfs regularly   Diet: good water, fruits/vegetables some   Patient is single.        PHYSICAL EXAM  Vitals:   07/20/18 1256  BP: (!) 154/89  Pulse: 78  Weight: 283 lb (128.4 kg)  Height: 6\' 1"  (1.854 m)   Body mass index is 37.34 kg/m.  Generalized: Well developed, obese male in no acute distress Well groomed Head: normocephalic and atraumatic,. Oropharynx benign .  Neck: Supple,   Musculoskeletal: No deformity   Neurological examination   Mentation: Alert oriented to time, place, history taking. Attention span and concentration appropriate. Recent and remote memory intact. Follows all commands speech and language fluent. Cranial nerve II-XII: Pupils were equal round reactive to light extraocular movements were full, visual field were full on confrontational test. Facial sensation and strength were  normal. hearing was intact to finger rubbing bilaterally. Uvula tongue midline. head turning and shoulder shrug were normal and symmetric.Tongue protrusion into cheek strength was normal. Motor: normal bulk and tone, full strength in the BUE, BLE, fine finger movements normal, no pronator drift. No focal weakness Sensory: normal and symmetric to light touch, pinprick, and Vibration,In the upper and lower extremities  Coordination: finger-nose-finger, heel-to-shin bilaterally, no dysmetria Reflexes: Symmetric upper and lower plantar responses were flexor bilaterally. Gait and Station: Rising up from seated position without assistance, normal stance, moderate stride, good arm swing, smooth turning, able to perform tiptoe, and heel walking without difficulty. Tandem gait is steady DIAGNOSTIC DATA (LABS, IMAGING, TESTING) - I reviewed patient records, labs, notes, testing and imaging myself where available.  Lab Results  Component Value Date   WBC 9.7 07/25/2015   HGB 15.8 07/25/2015   HCT 46.7 07/25/2015   MCV 90 07/25/2015   PLT 336 07/25/2015      Component Value Date/Time   NA 141 07/25/2015 1157   K 4.7 07/25/2015 1157   CL 102 07/25/2015 1157   CO2 25 07/25/2015 1157   GLUCOSE 103 (H) 07/25/2015 1157   GLUCOSE 140 (H) 07/24/2014 0820   BUN 12 07/25/2015 1157   CREATININE 0.89 07/25/2015 1157   CALCIUM 9.9 07/25/2015 1157   PROT 7.0 07/25/2015 1157   ALBUMIN 4.6 07/25/2015 1157   AST 18 07/25/2015 1157   ALT 31 07/25/2015 1157   ALKPHOS 93 07/25/2015 1157   BILITOT 0.3 07/25/2015 1157   GFRNONAA 120 07/25/2015 1157   GFRAA 138 07/25/2015 1157    ASSESSMENT AND PLAN 28 y.o. year old male has a past medical history of Seizure disorder (HCC) (2011)With last  seizure event while sleeping in May 2017. Patient is currently on Keppra 2500 mg daily without side effects.Marland Kitchen.    PLAN:  Keppra  5 caps daily 2500mg  will refill Call for any seizure activity F/U in 1 year Nilda RiggsNancy  Carolyn Hassan Blackshire, [facility]GNP, [facility] LtdBC, APRN  [facility] [address], [address] [place], [place] [id number] [phone]

## 2018-07-20 ENCOUNTER — Ambulatory Visit: Payer: Managed Care, Other (non HMO) | Admitting: Nurse Practitioner

## 2018-07-20 ENCOUNTER — Encounter: Payer: Self-pay | Admitting: Nurse Practitioner

## 2018-07-20 VITALS — BP 154/89 | HR 78 | Ht 73.0 in | Wt 283.0 lb

## 2018-07-20 DIAGNOSIS — G40909 Epilepsy, unspecified, not intractable, without status epilepticus: Secondary | ICD-10-CM

## 2018-07-20 NOTE — Patient Instructions (Signed)
Keppra  5 caps daily 2500mg  will refill Call for any seizure activity F/U in 1 year

## 2019-07-09 ENCOUNTER — Other Ambulatory Visit: Payer: Self-pay | Admitting: *Deleted

## 2019-07-09 ENCOUNTER — Other Ambulatory Visit: Payer: Self-pay | Admitting: Neurology

## 2019-07-09 DIAGNOSIS — G40209 Localization-related (focal) (partial) symptomatic epilepsy and epileptic syndromes with complex partial seizures, not intractable, without status epilepticus: Secondary | ICD-10-CM

## 2019-07-09 DIAGNOSIS — G40909 Epilepsy, unspecified, not intractable, without status epilepticus: Secondary | ICD-10-CM

## 2019-07-09 MED ORDER — LEVETIRACETAM ER 500 MG PO TB24: ORAL_TABLET | ORAL | 11 refills | Status: DC

## 2019-07-09 NOTE — Telephone Encounter (Signed)
Keppra has been refilled.

## 2019-07-09 NOTE — Telephone Encounter (Signed)
Pt is requesting a refill of levETIRAcetam (KEPPRA XR) 500 MG 24 hr tablet, to be sent to [id number] - [place], [place] - [address]

## 2019-07-25 NOTE — Progress Notes (Signed)
PATIENT: [name] DOB: 1990/09/25  REASON FOR VISIT: follow up HISTORY FROM: patient  HISTORY OF PRESENT ILLNESS: Today 07/26/19  Mr Missey is a 29 year old male with history of seizure disorder.  His last seizure occurred in May 2017.  He remains on Keppra extended release 2500 mg daily.  He is tolerating medication without side effect.  He has not had recurrent seizure.  He is gainfully employed full-time.  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  His overall health has been good.  He is considering smoking cessation.  In the last year he has lost 10 pounds.  He presents today for evaluation unaccompanied.  HISTORY  HISTORY OF PRESENT ILLNESS:UPDATE 1/16/2020CM Mr. Ho, 29 year old male returns for follow-up with history of seizure disorder.  Last seizure occurred in May 2017.  He is currently on Keppra 2500 mg extended release daily.  He is now working full-time.  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  No interval medical issues.  He returns for reevaluation  REVIEW OF SYSTEMS: Out of a complete 14 system review of symptoms, the patient complains only of the following symptoms, and all other reviewed systems are negative.  Seizure  ALLERGIES: No Known Allergies  HOME MEDICATIONS: Outpatient Medications Prior to Visit  Medication Sig Dispense Refill  . acetaminophen (TYLENOL) 500 MG tablet Take 1,000 mg by mouth every 6 (six) hours as needed for mild pain.    Marland Kitchen ibuprofen (ADVIL,MOTRIN) 200 MG tablet Take 200 mg by mouth every 6 (six) hours as needed.    . levETIRAcetam (KEPPRA XR) 500 MG 24 hr tablet TAKE FIVE (5) TABLETS BY MOUTH DAILY 150 tablet 11   No facility-administered medications prior to visit.    PAST MEDICAL HISTORY: Past Medical History:  Diagnosis Date  . History of chicken pox   . Seizure disorder (HCC) 2011   P[facility]alli at [facility], MRI and EEG WNL  . Smoker     PAST SURGICAL HISTORY: Past Surgical History:  Procedure Laterality Date  . APPENDECTOMY   2004    FAMILY HISTORY: Family History  Problem Relation Age of Onset  . High blood pressure Father   . Heart failure Father        CHF  . Cancer Maternal Aunt        breast  . Cancer Maternal Grandmother        breast  . Cancer Paternal Grandmother        breast  . Cancer Maternal Aunt 55       colon  . CAD Neg Hx   . Stroke Neg Hx   . Diabetes Neg Hx   . Hypertension Neg Hx     SOCIAL HISTORY: Social History   Socioeconomic History  . Marital status: Single    Spouse name: Not on file  . Number of children: 0  . Years of education: college  . Highest education level: Not on file  Occupational History    Comment: Not working  Tobacco Use  . Smoking status: Current Every Day Smoker    Packs/day: 0.50    Types: Cigarettes  . Smokeless tobacco: Never Used  . Tobacco comment: 3-4 cigarettes daily, 07/19/17 smoking 1/2 ppd  Substance and Sexual Activity  . Alcohol use: Yes    Alcohol/week: 0.0 standard drinks    Comment: Occasional (weekends) 10 per sitting  . Drug use: Yes    Types: Marijuana    Comment: 11/11/15 pt denies  . Sexual activity: Not on file  Other  Topics Concern  . Not on file  Social History Narrative   Caffeine: 1 cup/day   Lives with mother and father, has 1 dog.  1 older brother   Education full time Archivist.      Activity: golfs regularly   Diet: good water, fruits/vegetables some   Patient is single.      Social Determinants of Health   Financial Resource Strain:   . Difficulty of Paying Living Expenses: Not on file  Food Insecurity:   . Worried About Programme researcher, broadcasting/film/video in the Last Year: Not on file  . Ran Out of Food in the Last Year: Not on file  Transportation Needs:   . Lack of Transportation (Medical): Not on file  . Lack of Transportation (Non-Medical): Not on file  Physical Activity:   . Days of Exercise per Week: Not on file  . Minutes of Exercise per Session: Not on file  Stress:   . Feeling of Stress : Not on  file  Social Connections:   . Frequency of Communication with Friends and Family: Not on file  . Frequency of Social Gatherings with Friends and Family: Not on file  . Attends Religious Services: Not on file  . Active Member of Clubs or Organizations: Not on file  . Attends Banker Meetings: Not on file  . Marital Status: Not on file  Intimate Partner Violence:   . Fear of Current or Ex-Partner: Not on file  . Emotionally Abused: Not on file  . Physically Abused: Not on file  . Sexually Abused: Not on file   PHYSICAL EXAM  Vitals:   07/26/19 1310  BP: 133/86  Pulse: 71  Temp: (!) 97.2 F (36.2 C)  Weight: 272 lb (123.4 kg)  Height: 6\' 1"  (1.854 m)   Body mass index is 35.89 kg/m.  Generalized: Well developed, in no acute distress   Neurological examination  Mentation: Alert oriented to time, place, history taking. Follows all commands speech and language fluent Cranial nerve II-XII: Pupils were equal round reactive to light. Extraocular movements were full, visual field were full on confrontational test. Facial sensation and strength were normal.  Head turning and shoulder shrug  were normal and symmetric. Motor: The motor testing reveals 5 over 5 strength of all 4 extremities. Good symmetric motor tone is noted throughout.  Sensory: Sensory testing is intact to soft touch on all 4 extremities. No evidence of extinction is noted.  Coordination: Cerebellar testing reveals good finger-nose-finger and heel-to-shin bilaterally.  Gait and station: Gait is normal. Tandem gait is normal.  Reflexes: Deep tendon reflexes are symmetric and normal bilaterally.   DIAGNOSTIC DATA (LABS, IMAGING, TESTING) - I reviewed patient records, labs, notes, testing and imaging myself where available.  Lab Results  Component Value Date   WBC 9.7 07/25/2015   HGB 15.8 07/25/2015   HCT 46.7 07/25/2015   MCV 90 07/25/2015   PLT 336 07/25/2015      Component Value Date/Time   NA  141 07/25/2015 1157   K 4.7 07/25/2015 1157   CL 102 07/25/2015 1157   CO2 25 07/25/2015 1157   GLUCOSE 103 (H) 07/25/2015 1157   GLUCOSE 140 (H) 07/24/2014 0820   BUN 12 07/25/2015 1157   CREATININE 0.89 07/25/2015 1157   CALCIUM 9.9 07/25/2015 1157   PROT 7.0 07/25/2015 1157   ALBUMIN 4.6 07/25/2015 1157   AST 18 07/25/2015 1157   ALT 31 07/25/2015 1157   ALKPHOS 93 07/25/2015 1157  BILITOT 0.3 07/25/2015 1157   GFRNONAA 120 07/25/2015 1157   GFRAA 138 07/25/2015 1157   No results found for: CHOL, HDL, LDLCALC, LDLDIRECT, TRIG, CHOLHDL No results found for: HGBA1C No results found for: VITAMINB12 No results found for: TSH  ASSESSMENT AND PLAN 29 y.o. year old male  has a past medical history of History of chicken pox, Seizure disorder ([place]) (2011), and Smoker. here with:  1.  Seizure disorder  His seizures continue to remain well controlled on Keppra.  He will continue taking Keppra XR 500 mg tablet, 5 tablets daily.  His medication has already been sent in for refill for 1 year.  He will discuss smoking cessation with his primary doctor.  He has done well to lose 10 pounds in last year.  He will call for recurrent seizure, otherwise will follow up in 1 year or sooner if needed.  I spent 15 minutes with the patient. 50% of this time was spent discussing his plan of care.   Butler Denmark, AGNP-C, DNP 07/26/2019, 1:13 PM [facility] [address], [place], [place] [address] [phone]

## 2019-07-26 ENCOUNTER — Encounter: Payer: Self-pay | Admitting: Neurology

## 2019-07-26 ENCOUNTER — Other Ambulatory Visit: Payer: Self-pay

## 2019-07-26 ENCOUNTER — Ambulatory Visit: Payer: Managed Care, Other (non HMO) | Admitting: Neurology

## 2019-07-26 VITALS — BP 133/86 | HR 71 | Temp 97.2°F | Ht 73.0 in | Wt 272.0 lb

## 2019-07-26 DIAGNOSIS — G40909 Epilepsy, unspecified, not intractable, without status epilepticus: Secondary | ICD-10-CM

## 2019-07-26 NOTE — Patient Instructions (Signed)
Continue taking Keppra at current dosing, the refill was called in several weeks ago, call for recurrent seizure

## 2019-08-06 NOTE — Progress Notes (Signed)
I reviewed note and agree with plan.   Toriann Spadoni R. Rosalee Tolley, MD 08/06/2019, 9:58 AM Certified in Neurology, Neurophysiology and Neuroimaging  [facility] [address], [address] [place], [place] [address] [phone]  

## 2020-06-20 ENCOUNTER — Other Ambulatory Visit: Payer: Self-pay | Admitting: Neurology

## 2020-06-20 DIAGNOSIS — G40209 Localization-related (focal) (partial) symptomatic epilepsy and epileptic syndromes with complex partial seizures, not intractable, without status epilepticus: Secondary | ICD-10-CM

## 2020-06-20 DIAGNOSIS — G40909 Epilepsy, unspecified, not intractable, without status epilepticus: Secondary | ICD-10-CM

## 2020-07-31 ENCOUNTER — Ambulatory Visit: Payer: Managed Care, Other (non HMO) | Admitting: Neurology

## 2020-07-31 ENCOUNTER — Encounter: Payer: Self-pay | Admitting: Neurology

## 2020-07-31 DIAGNOSIS — G40909 Epilepsy, unspecified, not intractable, without status epilepticus: Secondary | ICD-10-CM | POA: Diagnosis not present

## 2020-07-31 MED ORDER — LEVETIRACETAM ER 500 MG PO TB24: ORAL_TABLET | ORAL | 4 refills | Status: DC

## 2020-07-31 NOTE — Progress Notes (Signed)
PATIENT: Zachary [name] DOB: 1990-11-23  REASON FOR VISIT: follow up HISTORY FROM: patient  HISTORY OF PRESENT ILLNESS: Today 07/31/20 Zachary [name] is a 30 year old male with history of seizure disorder.  Last seizure was in May 2017.  He remains on Keppra extended release 2500 mg daily.  Tolerates the medication well.  He takes it every morning.  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  He has cut back on cigarette smoking.  He had COVID a few weeks ago, lost 10 pounds.  Has not seen his PCP in quite a while.  He has no complaints today. Total of 4 seizures in his life. Here today for evaluation unaccompanied.  HISTORY 07/26/2019 SS: [name] is a 30 year old male with history of seizure disorder.  His last seizure occurred in May 2017.  He remains on Keppra extended release 2500 mg daily.  He is tolerating medication without side effect.  He has not had recurrent seizure.  He is gainfully employed full-time.  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  His overall health has been good.  He is considering smoking cessation.  In the last year he has lost 10 pounds.  He presents today for evaluation unaccompanied.   REVIEW OF SYSTEMS: Out of a complete 14 system review of symptoms, the patient complains only of the following symptoms, and all other reviewed systems are negative.  n/a  ALLERGIES: No Known Allergies  HOME MEDICATIONS: Outpatient Medications Prior to Visit  Medication Sig Dispense Refill  . acetaminophen (TYLENOL) 500 MG tablet Take 1,000 mg by mouth every 6 (six) hours as needed for mild pain.    Marland Kitchen ibuprofen (ADVIL,MOTRIN) 200 MG tablet Take 200 mg by mouth every 6 (six) hours as needed.    . levETIRAcetam (KEPPRA XR) 500 MG 24 hr tablet TAKE FIVE (5) TABLETS BY MOUTH DAILY 450 tablet 0   No facility-administered medications prior to visit.    PAST MEDICAL HISTORY: Past Medical History:  Diagnosis Date  . History of chicken pox   . Seizure disorder (HCC)  2011   [facility] at [facility], MRI and EEG WNL  . Smoker     PAST SURGICAL HISTORY: Past Surgical History:  Procedure Laterality Date  . APPENDECTOMY  2004    FAMILY HISTORY: Family History  Problem Relation Age of Onset  . High blood pressure Father   . Heart failure Father        CHF  . Cancer Maternal Aunt        breast  . Cancer Maternal Grandmother        breast  . Cancer Paternal Grandmother        breast  . Cancer Maternal Aunt 55       colon  . CAD Neg Hx   . Stroke Neg Hx   . Diabetes Neg Hx   . Hypertension Neg Hx     SOCIAL HISTORY: Social History   Socioeconomic History  . Marital status: Single    Spouse name: Not on file  . Number of children: 0  . Years of education: college  . Highest education level: Not on file  Occupational History    Comment: Not working  Tobacco Use  . Smoking status: Current Every Day Smoker    Packs/day: 0.50    Types: Cigarettes  . Smokeless tobacco: Never Used  . Tobacco comment: 3-4 cigarettes daily, 07/19/17 smoking 1/2 ppd  Substance and Sexual Activity  . Alcohol use: Yes  Alcohol/week: 0.0 standard drinks    Comment: Occasional (weekends) 10 per sitting  . Drug use: Yes    Types: Marijuana    Comment: 11/11/15 pt denies  . Sexual activity: Not on file  Other Topics Concern  . Not on file  Social History Narrative   Caffeine: 1 cup/day   Lives with mother and father, has 1 dog.  1 older brother   Education full time Archivist.      Activity: golfs regularly   Diet: good water, fruits/vegetables some   Patient is single.      Social Determinants of Health   Financial Resource Strain: Not on file  Food Insecurity: Not on file  Transportation Needs: Not on file  Physical Activity: Not on file  Stress: Not on file  Social Connections: Not on file  Intimate Partner Violence: Not on file   PHYSICAL EXAM  Vitals:   07/31/20 1317  BP: (!) 148/81  Pulse: 71  Weight: 260 lb (117.9 kg)  Height: 6'  1" (1.854 m)   Body mass index is 34.3 kg/m.  Generalized: Well developed, in no acute distress   Neurological examination  Mentation: Alert oriented to time, place, history taking. Follows all commands speech and language fluent Cranial nerve II-XII: Pupils were equal round reactive to light. Extraocular movements were full, visual field were full on confrontational test. Facial sensation and strength were normal. Head turning and shoulder shrug  were normal and symmetric. Motor: The motor testing reveals 5 over 5 strength of all 4 extremities. Good symmetric motor tone is noted throughout.  Sensory: Sensory testing is intact to soft touch on all 4 extremities. No evidence of extinction is noted.  Coordination: Cerebellar testing reveals good finger-nose-finger and heel-to-shin bilaterally.  Gait and station: Gait is normal.  Reflexes: Deep tendon reflexes are symmetric and normal bilaterally.   DIAGNOSTIC DATA (LABS, IMAGING, TESTING) - I reviewed patient records, labs, notes, testing and imaging myself where available.  Lab Results  Component Value Date   WBC 9.7 07/25/2015   HGB 15.8 07/25/2015   HCT 46.7 07/25/2015   MCV 90 07/25/2015   PLT 336 07/25/2015      Component Value Date/Time   NA 141 07/25/2015 1157   K 4.7 07/25/2015 1157   CL 102 07/25/2015 1157   CO2 25 07/25/2015 1157   GLUCOSE 103 (H) 07/25/2015 1157   GLUCOSE 140 (H) 07/24/2014 0820   BUN 12 07/25/2015 1157   CREATININE 0.89 07/25/2015 1157   CALCIUM 9.9 07/25/2015 1157   PROT 7.0 07/25/2015 1157   ALBUMIN 4.6 07/25/2015 1157   AST 18 07/25/2015 1157   ALT 31 07/25/2015 1157   ALKPHOS 93 07/25/2015 1157   BILITOT 0.3 07/25/2015 1157   GFRNONAA 120 07/25/2015 1157   GFRAA 138 07/25/2015 1157   No results found for: CHOL, HDL, LDLCALC, LDLDIRECT, TRIG, CHOLHDL No results found for: WUXL2G No results found for: VITAMINB12 No results found for: TSH  ASSESSMENT AND PLAN 30 y.o. year old male  has  a past medical history of History of chicken pox, Seizure disorder (HCC) (2011), and Smoker. here with:  1.  Seizure disorder -Seizures remain well controlled -Continue Keppra XR 500 mg tablet, 5 tablets daily -Refill sent for 1 year -Encouraged to continue smoking cessation, needs to schedule physical with PCP -Call for seizure activity, otherwise follow-up in 1 year or sooner if needed  I spent 20 minutes of face-to-face and non-face-to-face time with patient.  This included  previsit chart review, lab review, study review, order entry, electronic health record documentation, patient education.  Margie Ege, AGNP-C, DNP 07/31/2020, 1:36 PM [facility] [address], [address] [place], [place] [address] [phone]

## 2020-08-11 NOTE — Progress Notes (Signed)
I reviewed note and agree with plan.   Nabiha Planck R. Bentlie Catanzaro, MD 08/11/2020, 3:46 PM Certified in Neurology, Neurophysiology and Neuroimaging  [facility] [address], [address] [place], [place] [address] [phone]  

## 2021-08-03 ENCOUNTER — Ambulatory Visit: Payer: Managed Care, Other (non HMO) | Admitting: Neurology

## 2021-08-20 ENCOUNTER — Encounter: Payer: Self-pay | Admitting: Neurology

## 2021-08-20 ENCOUNTER — Telehealth: Payer: Managed Care, Other (non HMO) | Admitting: Neurology

## 2021-08-20 DIAGNOSIS — G40909 Epilepsy, unspecified, not intractable, without status epilepticus: Secondary | ICD-10-CM

## 2021-08-20 MED ORDER — LEVETIRACETAM ER 500 MG PO TB24: ORAL_TABLET | ORAL | 4 refills | Status: DC

## 2021-08-20 NOTE — Progress Notes (Signed)
° °  Virtual Visit via Video Note  I connected with Zachary [name] on 08/20/21 at  8:15 AM EST by a video enabled telemedicine application and verified that I am speaking with the correct person using two identifiers.  Location: Patient: at his home Provider: at the office    I discussed the limitations of evaluation and management by telemedicine and the availability of in person appointments. The patient expressed understanding and agreed to proceed.  History of Present Illness: 08/20/2021 SS: Zachary [name] here today for follow-up for seizures.  He continues to do excellent.  Last seizure was in May 2017.  He remains on Keppra and tolerating well.  He just moved into a new house.  He works full-time.  He has reduced his cigarette smoking to 1 pack a week.  He has no complaints today.  07/31/2020 SS: Zachary [name] is a 31 year old male with history of seizure disorder.  Last seizure was in May 2017.  He remains on Keppra extended release 2500 mg daily.  Tolerates the medication well.  He takes it every morning.  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  He has cut back on cigarette smoking.  He had COVID a few weeks ago, lost 10 pounds.  Has not seen his PCP in quite a while.  He has no complaints today. Total of 4 seizures in his life. Here today for evaluation unaccompanied.   Observations/Objective: Via virtual visit, is alert and oriented, speech is clear and concise, noted to move all extremities  Assessment and Plan: 1.  Seizure disorder  -Seizures remain under excellent control -Continue Keppra XR 500 mg tablet, 5 tablets daily -Call for seizure activity, otherwise follow-up in 1 year or sooner if needed  Follow Up Instructions: 15 min VV 08/25/22 4:00 VV   I discussed the assessment and treatment plan with the patient. The patient was provided an opportunity to ask questions and all were answered. The patient agreed with the plan and demonstrated an understanding of the  instructions.   The patient was advised to call back or seek an in-person evaluation if the symptoms worsen or if the condition fails to improve as anticipated.  Evangeline Dakin, DNP  [facility] [address] [place], [place] [address] [phone]

## 2022-08-24 NOTE — Progress Notes (Unsigned)
   Virtual Visit via Video Note  I connected with [name] on 08/24/22 at  4:00 PM EST by a video enabled telemedicine application and verified that I am speaking with the correct person using two identifiers.  Location: Patient: at his home Provider: in the office    I discussed the limitations of evaluation and management by telemedicine and the availability of in person appointments. The patient expressed understanding and agreed to proceed.  History of Present Illness: 08/25/22 SS: Here today VV, doing well, remains on Keppra XR 500, 5 tablets daily. No seizures. Getting closer to stopping smoking, down to a pack a week. He denies any new issues. 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   08/20/2021 SS: Zachary [name] here today for follow-up for seizures.  He continues to do excellent.  Last seizure was in May 2017.  He remains on Keppra and tolerating well. He just moved into a new house.  He works full-time.  He has reduced his cigarette smoking to 1 pack a week.  He has no complaints today.    Observations/Objective: Via virtual visit, is alert and oriented, speech is clear and concise, moves about freely  Assessment and Plan: Seizure Disorder  -Doing very well  -Continue Keppra XR 500 mg, 5 tablets daily  -Needs to schedule physical, labs  -Call for seizure activity, see you back in 1 year   Meds ordered this encounter  Medications   levETIRAcetam (KEPPRA XR) 500 MG 24 hr tablet    Sig: Take 5 tablets daily    Dispense:  450 tablet    Refill:  4     Follow Up Instructions: 1 year for VV   I discussed the assessment and treatment plan with the patient. The patient was provided an opportunity to ask questions and all were answered. The patient agreed with the plan and demonstrated an understanding of the instructions.   The patient was advised to call back or seek an in-person evaluation if the symptoms worsen or if the condition fails to improve  as anticipated.  Zachary Dakin, DNP  [facility] [address] [place],  [address] [phone]

## 2022-08-25 ENCOUNTER — Telehealth: Payer: Managed Care, Other (non HMO) | Admitting: Neurology

## 2022-08-25 DIAGNOSIS — G40909 Epilepsy, unspecified, not intractable, without status epilepticus: Secondary | ICD-10-CM | POA: Diagnosis not present

## 2022-08-25 MED ORDER — LEVETIRACETAM ER 500 MG PO TB24: ORAL_TABLET | ORAL | 4 refills | Status: DC

## 2022-08-25 NOTE — Patient Instructions (Signed)
Great to see you!  I will refill your Keppra.  See you back in 1 year! Take Care :)

## 2022-09-20 ENCOUNTER — Other Ambulatory Visit: Payer: Self-pay | Admitting: Neurology

## 2022-09-20 DIAGNOSIS — G40909 Epilepsy, unspecified, not intractable, without status epilepticus: Secondary | ICD-10-CM

## 2022-12-16 ENCOUNTER — Telehealth: Payer: Self-pay | Admitting: Neurology

## 2022-12-16 DIAGNOSIS — G40909 Epilepsy, unspecified, not intractable, without status epilepticus: Secondary | ICD-10-CM

## 2022-12-16 MED ORDER — LEVETIRACETAM ER 500 MG PO TB24
ORAL_TABLET | ORAL | 4 refills | Status: DC
Start: 2022-12-16 — End: 2023-08-31

## 2022-12-16 NOTE — Telephone Encounter (Signed)
Called pt. Informed him of message nurse [facility] sent. Pt said he need his refill a few day earlier because he is going out of town and only have a few more pills left.

## 2022-12-16 NOTE — Addendum Note (Signed)
Addended by: Eather Colas E on: 12/16/2022 12:00 PM   Modules accepted: Orders

## 2022-12-16 NOTE — Telephone Encounter (Signed)
Patient has refills on file, he will have to call pharmacy to see about refill

## 2022-12-16 NOTE — Telephone Encounter (Signed)
Please notify patient that early fill was sent

## 2022-12-16 NOTE — Telephone Encounter (Signed)
Pt is going out of state and fears he will run out of the medication before he returns.  Pt is asking If his levETIRAcetam (KEPPRA XR) 500 MG 24 hr tablet can be called in early to  [id number]

## 2023-08-31 ENCOUNTER — Telehealth (INDEPENDENT_AMBULATORY_CARE_PROVIDER_SITE_OTHER): Payer: Managed Care, Other (non HMO) | Admitting: Neurology

## 2023-08-31 DIAGNOSIS — G40909 Epilepsy, unspecified, not intractable, without status epilepticus: Secondary | ICD-10-CM | POA: Diagnosis not present

## 2023-08-31 MED ORDER — LEVETIRACETAM ER 500 MG PO TB24
ORAL_TABLET | ORAL | 4 refills | Status: AC
Start: 2023-08-31 — End: ?

## 2023-08-31 NOTE — Patient Instructions (Signed)
 Great to see you today.  Continue Keppra for seizure prevention.  Recommend follow-up with primary care for routine labs and physical.  Utilize [facility] to see if he can get a discount on the Keppra.  Call for seizures.  I will see you back in 1 year.  Thanks!!

## 2023-08-31 NOTE — Progress Notes (Signed)
   Virtual Visit via Video Note  I connected with Zachary [name] on 08/31/23 at  3:45 PM EST by a video enabled telemedicine application and verified that I am speaking with the correct person using two identifiers.  Location: Patient: at his home Provider: in the office    I discussed the limitations of evaluation and management by telemedicine and the availability of in person appointments. The patient expressed understanding and agreed to proceed.  History of Present Illness: 08/31/23 SS: At home via VV, doing well. Got new insurance went from 3 month supply from $30 to $190. He has almost quit smoking cigarettes only 1-2 a day. Has not established with primary care doctor.  No recent seizures.  Doing overall well. Looking to change jobs.   08/25/22 SS: Here today VV, doing well, remains on Keppra XR 500, 5 tablets daily. No seizures. Getting closer to stopping smoking, down to a pack a week. He denies any new issues. 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   08/20/2021 SS: Jaquil here today for follow-up for seizures.  He continues to do excellent.  Last seizure was in May 2017.  He remains on Keppra and tolerating well. He just moved into a new house.  He works full-time.  He has reduced his cigarette smoking to 1 pack a week.  He has no complaints today.    Observations/Objective: Via virtual visit, is alert and oriented, speech is clear and concise, moves about freely  Assessment and Plan: Seizure Disorder  -Doing very well  -Continue Keppra XR 500 mg, 5 tablets daily, discussed using [facility] [employer] to see if better deal  -Recommend follow-up with primary care for routine labs and physical -Call for seizure activity, see you back in 1 year   Follow Up Instructions: 1 year for VV   I discussed the assessment and treatment plan with the patient. The patient was provided an opportunity to ask questions and all were answered. The patient agreed with the plan and  demonstrated an understanding of the instructions.   The patient was advised to call back or seek an in-person evaluation if the symptoms worsen or if the condition fails to improve as anticipated.  Otila Kluver, DNP  [facility] [address], [address] [place], [place] [address] [phone]

## 2024-09-05 ENCOUNTER — Telehealth: Payer: Self-pay | Admitting: Neurology
